# Patient Record
Sex: Female | Born: 1995 | Race: Black or African American | Hispanic: No | Marital: Single | Smoking: Never smoker
Health system: Southern US, Community
[De-identification: ages and names within clinical notes are randomized; demographics above are authoritative.]

## PROBLEM LIST (undated history)

## (undated) DIAGNOSIS — F329 Major depressive disorder, single episode, unspecified: Secondary | ICD-10-CM

## (undated) DIAGNOSIS — F32A Depression, unspecified: Secondary | ICD-10-CM

## (undated) DIAGNOSIS — R519 Headache, unspecified: Secondary | ICD-10-CM

## (undated) DIAGNOSIS — E119 Type 2 diabetes mellitus without complications: Secondary | ICD-10-CM

## (undated) DIAGNOSIS — Z8759 Personal history of other complications of pregnancy, childbirth and the puerperium: Secondary | ICD-10-CM

## (undated) DIAGNOSIS — A609 Anogenital herpesviral infection, unspecified: Secondary | ICD-10-CM

## (undated) DIAGNOSIS — Z5189 Encounter for other specified aftercare: Secondary | ICD-10-CM

## (undated) DIAGNOSIS — O24419 Gestational diabetes mellitus in pregnancy, unspecified control: Secondary | ICD-10-CM

## (undated) DIAGNOSIS — T8859XA Other complications of anesthesia, initial encounter: Secondary | ICD-10-CM

## (undated) HISTORY — DX: Type 2 diabetes mellitus without complications: E11.9

## (undated) HISTORY — DX: Depression, unspecified: F32.A

## (undated) HISTORY — DX: Headache, unspecified: R51.9

## (undated) HISTORY — PX: WISDOM TOOTH EXTRACTION: SHX21

## (undated) HISTORY — DX: Anogenital herpesviral infection, unspecified: A60.9

---
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
## 2016-06-17 ENCOUNTER — Emergency Department (HOSPITAL_COMMUNITY)
Admission: EM | Admit: 2016-06-17 | Discharge: 2016-06-18 | Discharge status: Against Medical Advice | Payer: Medicaid Other | Attending: Dermatology | Admitting: Dermatology

## 2016-06-17 ENCOUNTER — Encounter (HOSPITAL_COMMUNITY): Payer: Self-pay

## 2016-06-17 DIAGNOSIS — Z5321 Procedure and treatment not carried out due to patient leaving prior to being seen by health care provider: Secondary | ICD-10-CM | POA: Diagnosis not present

## 2016-06-17 DIAGNOSIS — O26891 Other specified pregnancy related conditions, first trimester: Secondary | ICD-10-CM | POA: Diagnosis not present

## 2016-06-17 DIAGNOSIS — Z3A01 Less than 8 weeks gestation of pregnancy: Secondary | ICD-10-CM | POA: Diagnosis not present

## 2016-06-17 NOTE — ED Triage Notes (Signed)
Pt c/o migraine and pain behind her R etye since Thursday. Pt endorsing photo sensitivity. Denies Nausea/vomiting. Pt is [redacted] weeks pregnant.

## 2019-10-17 ENCOUNTER — Emergency Department (HOSPITAL_COMMUNITY)
Admission: EM | Admit: 2019-10-17 | Discharge: 2019-10-17 | Disposition: A | Discharge status: Home / Self Care | Payer: Medicaid Other | Attending: Emergency Medicine | Admitting: Emergency Medicine

## 2019-10-17 ENCOUNTER — Other Ambulatory Visit: Payer: Self-pay

## 2019-10-17 ENCOUNTER — Ambulatory Visit (HOSPITAL_COMMUNITY)
Admission: EM | Admit: 2019-10-17 | Discharge: 2019-10-17 | Disposition: A | Discharge status: Home / Self Care | Payer: Medicaid Other

## 2019-10-17 ENCOUNTER — Encounter (HOSPITAL_COMMUNITY): Payer: Self-pay | Admitting: Emergency Medicine

## 2019-10-17 ENCOUNTER — Emergency Department (HOSPITAL_COMMUNITY): Payer: Medicaid Other

## 2019-10-17 DIAGNOSIS — M546 Pain in thoracic spine: Secondary | ICD-10-CM | POA: Insufficient documentation

## 2019-10-17 DIAGNOSIS — X500XXA Overexertion from strenuous movement or load, initial encounter: Secondary | ICD-10-CM | POA: Insufficient documentation

## 2019-10-17 DIAGNOSIS — R0789 Other chest pain: Secondary | ICD-10-CM | POA: Insufficient documentation

## 2019-10-17 DIAGNOSIS — Y93F2 Activity, caregiving, lifting: Secondary | ICD-10-CM | POA: Insufficient documentation

## 2019-10-17 LAB — BASIC METABOLIC PANEL
Anion gap: 10 (ref 5–15)
BUN: 6 mg/dL (ref 6–20)
CO2: 25 mmol/L (ref 22–32)
Calcium: 8.4 mg/dL — ABNORMAL LOW (ref 8.9–10.3)
Chloride: 103 mmol/L (ref 98–111)
Creatinine, Ser: 0.63 mg/dL (ref 0.44–1.00)
GFR calc Af Amer: >60 mL/min (ref >60)
GFR calc non Af Amer: >60 mL/min (ref >60)
Glucose, Bld: 102 mg/dL — ABNORMAL HIGH (ref 70–99)
Potassium: 4.1 mmol/L (ref 3.5–5.1)
Sodium: 138 mmol/L (ref 135–145)

## 2019-10-17 LAB — TROPONIN I (HIGH SENSITIVITY)
Troponin I (High Sensitivity): <2 ng/L (ref <18)
Troponin I (High Sensitivity): <2 ng/L (ref <18)

## 2019-10-17 LAB — CBC
HCT: 42.2 % (ref 36.0–46.0)
Hemoglobin: 12.6 g/dL (ref 12.0–15.0)
MCH: 24.5 pg — ABNORMAL LOW (ref 26.0–34.0)
MCHC: 29.9 g/dL — ABNORMAL LOW (ref 30.0–36.0)
MCV: 81.9 fL (ref 80.0–100.0)
Platelets: 219 10*3/uL (ref 150–400)
RBC: 5.15 MIL/uL — ABNORMAL HIGH (ref 3.87–5.11)
RDW: 15 % (ref 11.5–15.5)
WBC: 8.2 10*3/uL (ref 4.0–10.5)
nRBC: 0 % (ref 0.0–0.2)

## 2019-10-17 LAB — I-STAT BETA HCG BLOOD, ED (MC, WL, AP ONLY): I-stat hCG, quantitative: <5 m[IU]/mL (ref <5)

## 2019-10-17 MED ORDER — SODIUM CHLORIDE 0.9% FLUSH: 3.0000 mL | Freq: Once | INTRAVENOUS | Status: DC

## 2019-10-17 MED ORDER — TIZANIDINE HCL 2 MG PO CAPS: 2.0000 mg | ORAL_CAPSULE | Freq: Three times a day (TID) | ORAL | 0 refills | Status: DC

## 2019-10-17 NOTE — ED Provider Notes (Signed)
[facility] EMERGENCY DEPARTMENT Provider Note   CSN: [id number] Arrival date & time: 10/17/19  1221     History Chief Complaint  Patient presents with  . Back Pain  . Chest Pain    Sherry Gonzales is a 24 y.o. female.  HPI    This young female presents with concern of back and chest pain.  She states that she is generally well, though she does have ongoing evaluation for proteinuria, as well as therapy for UTI. Yesterday, after lifting another individual she felt relatively sudden onset pain in the midthoracic back. Pain is sore, spasm-like, and since onset has had periods of circumferential pain throughout her anterior thorax as well. No dyspnea, no fever, no nausea, no vomiting.  She has not taken any medication for pain relief.  She notes that she has a history of anxiety, and today, after the pain wrapped around to her chest, and she was particularly anxious that she went to urgent care.  With concern of chest pain she was sent here for evaluation.  History reviewed. No pertinent past medical history.  There are no problems to display for this patient.   No past surgical history on file.   OB History    Gravida  1   Para      Term      Preterm      AB      Living        SAB      TAB      Ectopic      Multiple      Live Births              No family history on file.  Social History   Tobacco Use  . Smoking status: Never Smoker  . Smokeless tobacco: Never Used  Substance Use Topics  . Alcohol use: Not on file  . Drug use: Not on file    Home Medications Prior to Admission medications   Not on File    Allergies    Penicillins  Review of Systems   Review of Systems  Constitutional:       Per HPI, otherwise negative  HENT:       Per HPI, otherwise negative  Respiratory:       Per HPI, otherwise negative  Cardiovascular:       Per HPI, otherwise negative  Gastrointestinal: Negative for vomiting.  Endocrine:      Negative aside from HPI  Genitourinary:       Neg aside from HPI   Musculoskeletal:       Per HPI, otherwise negative  Skin: Negative.   Neurological: Negative for syncope.    Physical Exam Updated Vital Signs BP (!) 117/91 (BP Location: Right Arm)   Pulse 84   Temp 98.8 F (37.1 C) (Oral)   Resp 16   SpO2 99%   Physical Exam Vitals and nursing note reviewed.  Constitutional:      General: She is not in acute distress.    Appearance: She is well-developed.  HENT:     Head: Normocephalic and atraumatic.  Eyes:     Conjunctiva/sclera: Conjunctivae normal.  Cardiovascular:     Rate and Rhythm: Normal rate and regular rhythm.  Pulmonary:     Effort: Pulmonary effort is normal. No respiratory distress.     Breath sounds: Normal breath sounds. No stridor. No decreased breath sounds or wheezing.  Abdominal:     General: There is no  distension.  Musculoskeletal:     Comments: Mild tenderness to palpation mid thoracic area, no crepitus, no deformities, no step-off  Skin:    General: Skin is warm and dry.  Neurological:     Mental Status: She is alert and oriented to person, place, and time.     Cranial Nerves: No cranial nerve deficit.     Motor: No weakness, tremor, atrophy or abnormal muscle tone.     Coordination: Coordination normal.     ED Results / Procedures / Treatments   Labs (all labs ordered are listed, but only abnormal results are displayed) Labs Reviewed  BASIC METABOLIC PANEL - Abnormal; Notable for the following components:      Result Value   Glucose, Bld 102 (*)    Calcium 8.4 (*)    All other components within normal limits  CBC - Abnormal; Notable for the following components:   RBC 5.15 (*)    MCH 24.5 (*)    MCHC 29.9 (*)    All other components within normal limits  I-STAT BETA HCG BLOOD, ED ([facility], [facility], [facility] ONLY)  TROPONIN I (HIGH SENSITIVITY)  TROPONIN I (HIGH SENSITIVITY)    EKG EKG Interpretation  Date/Time:  Thursday October 17 2019  12:30:17 EST Ventricular Rate:  89 PR Interval:  130 QRS Duration: 74 QT Interval:  370 QTC Calculation: 450 R Axis:   56 Text Interpretation: Normal sinus rhythm Artifact Otherwise within normal limits Confirmed by Gerhard Munch [phone]) on 10/17/2019 3:12:38 PM   Radiology DG Chest 2 View  Result Date: 10/17/2019 CLINICAL DATA:  Chest pain today. EXAM: CHEST - 2 VIEW COMPARISON:  None. FINDINGS: Lungs clear. Heart size normal. No pneumothorax or pleural fluid. No acute or focal bony abnormality. IMPRESSION: Normal chest. Electronically Signed   By: Drusilla Kanner M.D.   On: 10/17/2019 13:00    Procedures Procedures (including critical care time)  Medications Ordered in ED Medications  sodium chloride flush (NS) 0.9 % injection 3 mL (has no administration in time range)    ED Course  I have reviewed the triage vital signs and the nursing notes.  Pertinent labs & imaging results that were available during my care of the patient were reviewed by me and considered in my medical decision making (see chart for details).    MDM Rules/Calculators/A&P                      Young female presents with midthoracic and chest pain after identifiable precipitant. With her generally benign health condition, onset of pain following lifting other individuals or suspicion for musculoskeletal etiology. No red flags, no neuro findings, nor complaints.  Patient has no history of cardiac disease, EKG is unremarkable, labs unremarkable.  Patient has not yet taken any medication for relief, was started on a course of anti-inflammatories, muscle relaxants. Patient amenable to follow-up with physical therapy and/or chiropractic for additional ongoing care. Final Clinical Impression(s) / ED Diagnoses Final diagnoses:  Acute midline thoracic back pain  Atypical chest pain    Rx / DC Orders ED Discharge Orders         Ordered    tizanidine (ZANAFLEX) 2 MG capsule  3 times daily     10/17/19 1555            Gerhard Munch, MD 10/17/19 1556

## 2019-10-17 NOTE — Discharge Instructions (Signed)
As discussed, your evaluation today has been largely reassuring.  But, it is important that you monitor your condition carefully, and do not hesitate to return to the ED if you develop new, or concerning changes in your condition.  For the next 3 days please use ibuprofen, 400 mg, 3 times daily, taken with food for relief. In addition, you may use the prescribed muscle relaxer for additional therapeutic benefit.

## 2019-10-17 NOTE — ED Triage Notes (Signed)
Pt states " I pulled a muscle in my back yesterday after picking up a friend". Pt states just PTA she was standing at [facility] when suddenly the pain went into her chest and patient had someone at the store drive her here. Pt is hunched over in triage and states she cant sit up due to pain.

## 2019-10-17 NOTE — ED Notes (Signed)
Patient verbalizes understanding of discharge instructions. Opportunity for questioning and answers were provided. Armband removed by staff, pt discharged from ED.  

## 2019-10-17 NOTE — ED Notes (Addendum)
[phone] Winders- mother

## 2020-02-19 LAB — OB RESULTS CONSOLE GC/CHLAMYDIA
Chlamydia: NEGATIVE
Gonorrhea: NEGATIVE

## 2020-02-19 LAB — OB RESULTS CONSOLE ABO/RH: RH Type: POSITIVE

## 2020-02-19 LAB — OB RESULTS CONSOLE HEPATITIS B SURFACE ANTIGEN: Hepatitis B Surface Ag: NEGATIVE

## 2020-02-19 LAB — OB RESULTS CONSOLE HIV ANTIBODY (ROUTINE TESTING): HIV: NONREACTIVE

## 2020-02-19 LAB — OB RESULTS CONSOLE RUBELLA ANTIBODY, IGM: Rubella: IMMUNE

## 2020-02-19 LAB — OB RESULTS CONSOLE RPR: RPR: NONREACTIVE

## 2020-02-19 LAB — OB RESULTS CONSOLE ANTIBODY SCREEN: Antibody Screen: NEGATIVE

## 2020-04-01 ENCOUNTER — Encounter: Payer: Medicaid Other | Attending: Obstetrics and Gynecology | Admitting: Registered"

## 2020-05-22 ENCOUNTER — Other Ambulatory Visit: Payer: Self-pay | Admitting: Obstetrics and Gynecology

## 2020-05-22 DIAGNOSIS — Z3A22 22 weeks gestation of pregnancy: Secondary | ICD-10-CM

## 2020-05-22 DIAGNOSIS — Z36 Encounter for antenatal screening for chromosomal anomalies: Secondary | ICD-10-CM

## 2020-05-28 ENCOUNTER — Encounter: Payer: Self-pay | Admitting: *Deleted

## 2020-06-01 ENCOUNTER — Ambulatory Visit: Payer: Medicaid Other | Attending: Obstetrics and Gynecology

## 2020-06-01 ENCOUNTER — Ambulatory Visit: Payer: Medicaid Other

## 2020-06-08 ENCOUNTER — Emergency Department (HOSPITAL_COMMUNITY)
Admission: EM | Admit: 2020-06-08 | Discharge: 2020-06-08 | Disposition: A | Discharge status: Home / Self Care | Payer: Medicaid Other | Attending: Emergency Medicine | Admitting: Emergency Medicine

## 2020-06-08 ENCOUNTER — Emergency Department (HOSPITAL_COMMUNITY): Payer: Medicaid Other

## 2020-06-08 ENCOUNTER — Encounter (HOSPITAL_COMMUNITY): Payer: Self-pay

## 2020-06-08 ENCOUNTER — Other Ambulatory Visit: Payer: Self-pay

## 2020-06-08 DIAGNOSIS — Z3A00 Weeks of gestation of pregnancy not specified: Secondary | ICD-10-CM | POA: Insufficient documentation

## 2020-06-08 DIAGNOSIS — R079 Chest pain, unspecified: Secondary | ICD-10-CM | POA: Insufficient documentation

## 2020-06-08 DIAGNOSIS — R05 Cough: Secondary | ICD-10-CM | POA: Diagnosis not present

## 2020-06-08 DIAGNOSIS — Z5321 Procedure and treatment not carried out due to patient leaving prior to being seen by health care provider: Secondary | ICD-10-CM | POA: Insufficient documentation

## 2020-06-08 DIAGNOSIS — O26899 Other specified pregnancy related conditions, unspecified trimester: Secondary | ICD-10-CM | POA: Diagnosis present

## 2020-06-08 DIAGNOSIS — U071 COVID-19: Secondary | ICD-10-CM | POA: Insufficient documentation

## 2020-06-08 NOTE — ED Triage Notes (Signed)
Patient states she tested + Covid 3 days ago. Patient c/o chest pain and cough.  Patient states she is 6 months pregnant.

## 2020-06-08 NOTE — ED Notes (Signed)
Patient left out of Triage 3 sometime this afternoon and stated she told a nurse that she was leaving. Patient returned and wanted to be seen. Patient is currently in the waiting room. Patient's name was never removed.

## 2020-06-12 ENCOUNTER — Encounter (HOSPITAL_COMMUNITY): Payer: Self-pay | Admitting: Obstetrics and Gynecology

## 2020-06-12 ENCOUNTER — Other Ambulatory Visit: Payer: Self-pay

## 2020-06-12 ENCOUNTER — Inpatient Hospital Stay (HOSPITAL_COMMUNITY)
Admission: AD | Admit: 2020-06-12 | Discharge: 2020-06-12 | Disposition: A | Discharge status: Home / Self Care | Payer: Medicaid Other | Source: Ambulatory Visit | Attending: Obstetrics and Gynecology | Admitting: Obstetrics and Gynecology

## 2020-06-12 ENCOUNTER — Inpatient Hospital Stay (HOSPITAL_COMMUNITY): Payer: Medicaid Other

## 2020-06-12 DIAGNOSIS — Z7984 Long term (current) use of oral hypoglycemic drugs: Secondary | ICD-10-CM | POA: Insufficient documentation

## 2020-06-12 DIAGNOSIS — O24112 Pre-existing diabetes mellitus, type 2, in pregnancy, second trimester: Secondary | ICD-10-CM | POA: Insufficient documentation

## 2020-06-12 DIAGNOSIS — O98512 Other viral diseases complicating pregnancy, second trimester: Secondary | ICD-10-CM | POA: Diagnosis not present

## 2020-06-12 DIAGNOSIS — Z880 Allergy status to penicillin: Secondary | ICD-10-CM | POA: Diagnosis not present

## 2020-06-12 DIAGNOSIS — E119 Type 2 diabetes mellitus without complications: Secondary | ICD-10-CM | POA: Diagnosis not present

## 2020-06-12 DIAGNOSIS — Z3A25 25 weeks gestation of pregnancy: Secondary | ICD-10-CM | POA: Diagnosis not present

## 2020-06-12 DIAGNOSIS — U071 COVID-19: Secondary | ICD-10-CM | POA: Diagnosis not present

## 2020-06-12 DIAGNOSIS — J069 Acute upper respiratory infection, unspecified: Secondary | ICD-10-CM

## 2020-06-12 DIAGNOSIS — Z833 Family history of diabetes mellitus: Secondary | ICD-10-CM | POA: Diagnosis not present

## 2020-06-12 LAB — URINALYSIS, ROUTINE W REFLEX MICROSCOPIC
Bilirubin Urine: NEGATIVE
Glucose, UA: NEGATIVE mg/dL
Hgb urine dipstick: NEGATIVE
Ketones, ur: NEGATIVE mg/dL
Leukocytes,Ua: NEGATIVE
Nitrite: NEGATIVE
Protein, ur: 100 mg/dL — AB
Specific Gravity, Urine: 1.017 (ref 1.005–1.030)
pH: 8 (ref 5.0–8.0)

## 2020-06-12 LAB — GLUCOSE, CAPILLARY: Glucose-Capillary: 101 mg/dL — ABNORMAL HIGH (ref 70–99)

## 2020-06-12 MED ORDER — GUAIFENESIN 200 MG PO TABS
200.0000 mg | ORAL_TABLET | ORAL | Status: DC | PRN
  Administered 2020-06-12: 200 mg via ORAL
  Filled 2020-06-12 (×2): qty 1

## 2020-06-12 MED ORDER — GUAIFENESIN 200 MG PO TABS: 200.0000 mg | ORAL_TABLET | ORAL | 0 refills | Status: AC | PRN

## 2020-06-12 MED ORDER — BENZONATATE 100 MG PO CAPS
200.0000 mg | ORAL_CAPSULE | Freq: Two times a day (BID) | ORAL | Status: DC
  Administered 2020-06-12: 200 mg via ORAL
  Filled 2020-06-12 (×2): qty 2

## 2020-06-12 MED ORDER — BENZONATATE 200 MG PO CAPS: 200.0000 mg | ORAL_CAPSULE | Freq: Two times a day (BID) | ORAL | 0 refills | Status: AC

## 2020-06-12 NOTE — Discharge Instructions (Signed)
10 Things You Can Do to Manage Your COVID-19 Symptoms at Home If you have possible or confirmed COVID-19: 1. Stay home from work and school. And stay away from other public places. If you must go out, avoid using any kind of public transportation, ridesharing, or taxis. 2. Monitor your symptoms carefully. If your symptoms get worse, call your healthcare provider immediately. 3. Get rest and stay hydrated. 4. If you have a medical appointment, call the healthcare provider ahead of time and tell them that you have or may have COVID-19. 5. For medical emergencies, call 911 and notify the dispatch personnel that you have or may have COVID-19. 6. Cover your cough and sneezes with a tissue or use the inside of your elbow. 7. Wash your hands often with soap and water for at least 20 seconds or clean your hands with an alcohol-based hand sanitizer that contains at least 60% alcohol. 8. As much as possible, stay in a specific room and away from other people in your home. Also, you should use a separate bathroom, if available. If you need to be around other people in or outside of the home, wear a mask. 9. Avoid sharing personal items with other people in your household, like dishes, towels, and bedding. 10. Clean all surfaces that are touched often, like counters, tabletops, and doorknobs. Use household cleaning sprays or wipes according to the label instructions. cdc.gov/coronavirus 04/10/2019 This information is not intended to replace advice given to you by your health care provider. Make sure you discuss any questions you have with your health care provider. Document Revised: 09/12/2019 Document Reviewed: 09/12/2019 Elsevier Patient Education  2020 Elsevier Inc.  

## 2020-06-12 NOTE — MAU Note (Signed)
Pt tested positive for covid on Last Friday . C/O bad cough that makes her urinate or have a BM (uncontrolled) Coughing up small amount of clear phlegm .Taking Delsym for the cough without much relief. Denies any fever or SOB. Also reports urine is very dark has history of hematuria and kidney stones but not seeing blood at this time.

## 2020-06-12 NOTE — MAU Provider Note (Signed)
Patient Shauniece Gonzales is a 24 y.o. [id number]  at [redacted]w[redacted]d here with complaints of cough and blood in her urine. She denies decreased fetal movements, vaginal bleeding, contractions, fever, SOB, all over body aches. She is coughing so bad that sometimes she urinates and defecates on herself. She feels like she is improving overall but still has her cough.  She reports that her urine is dark and she was concerned about blood, but she has no true vaginal bleeding.   History     CSN: [id number]  Arrival date and time: 06/12/20 1317   None     Chief Complaint  Patient presents with   Cough   Cough This is a new problem. The current episode started in the past 7 days. The problem has been unchanged. The cough is productive of sputum. Associated symptoms include chest pain and nasal congestion. Pertinent negatives include no fever, rhinorrhea or shortness of breath.   She says that the coughing is worse on exertion. She took half a bottle of Delsym and it did not help.  OB History    Gravida  4   Para  1   Term  1   Preterm      AB  2   Living  1     SAB      TAB  2   Ectopic      Multiple      Live Births  1           Past Medical History:  Diagnosis Date   Depression    Diabetes mellitus without complication (HCC)    Headache    HSV (herpes simplex virus) anogenital infection     Past Surgical History:  Procedure Laterality Date   CESAREAN SECTION      Family History  Problem Relation Age of Onset   Diabetes Mother    Hypertension Mother    Diabetes Father    Hypertension Father     Social History   Tobacco Use   Smoking status: Never Smoker   Smokeless tobacco: Never Used  Vaping Use   Vaping Use: Never used  Substance Use Topics   Alcohol use: Not Currently   Drug use: Never    Allergies:  Allergies  Allergen Reactions   Penicillins Hives    Did it involve swelling of the face/tongue/throat, SOB, or low BP? No Did it  involve sudden or severe rash/hives, skin peeling, or any reaction on the inside of your mouth or nose? Yes Did you need to seek medical attention at a hospital or doctor's office? Yes When did it last happen?pt was 24yo If all above answers are NO, may proceed with cephalosporin use.     Medications Prior to Admission  Medication Sig Dispense Refill Last Dose   metFORMIN (GLUCOPHAGE) 500 MG tablet Take 500 mg by mouth daily with breakfast.   06/12/2020 at Unknown time   Prenatal Vit-Fe Fumarate-FA (PRENATAL MULTIVITAMIN) TABS tablet Take 1 tablet by mouth daily at 12 noon.   06/12/2020 at Unknown time   valACYclovir (VALTREX) 1000 MG tablet Take 0.5 g by mouth See admin instructions. Take 1/2 tablet twice daily for 3-5 days as needed for flare ups.   06/12/2020 at Unknown time   nitrofurantoin, macrocrystal-monohydrate, (MACROBID) 100 MG capsule Take 100 mg by mouth 2 (two) times daily.      tizanidine (ZANAFLEX) 2 MG capsule Take 1 capsule (2 mg total) by mouth 3 (three) times daily. 15 capsule 0  Review of Systems  Constitutional: Negative.  Negative for fever.  HENT: Negative.  Negative for rhinorrhea.   Respiratory: Positive for cough. Negative for shortness of breath.   Cardiovascular: Positive for chest pain.  Gastrointestinal: Negative.   Genitourinary: Negative.   Musculoskeletal: Negative.   Neurological: Negative.    Physical Exam   Blood pressure 109/69, pulse (!) 101, temperature 98.1 F (36.7 C), resp. rate 18, last menstrual period 12/14/2019, SpO2 97 %.  Physical Exam Constitutional:      Appearance: Normal appearance.  HENT:     Head: Normocephalic.  Cardiovascular:     Rate and Rhythm: Normal rate.     Pulses: Normal pulses.  Pulmonary:     Effort: Pulmonary effort is normal. No respiratory distress.     Breath sounds: No stridor. No wheezing or rhonchi.  Genitourinary:    General: Normal vulva.  Neurological:     Mental Status: She is alert.      MAU Course  Procedures  MDM -Consult with COVID Team; per Dr. Jerral Ralph, do not need to order full COVID work-up but will do chest xray to rule out pneumonia; patient is not eligible for monoclonal antibody treatment due to COVID diagnosis further out than 10 days ago.  -patient appears uncomfortable in bed with dry cough but has reassuring Resp and SpO2.  -UA is normal -Blood sugar is 101; patient would like a small snack.  -Patient had guafenisen and tessalon pearls.  -NST: 150 bpm, mod var, present acel, no decels, no contractions.   Patient Vitals for the past 24 hrs:  BP Temp Pulse Resp SpO2  06/12/20 1403 109/69 98.1 F (36.7 C) (!) 101 18 97 %   Patient eating, talking and laughing while in MAU. She continues to have O2 Sat above 95%, and no fever, SOB.  Reports that she has not been coughing while in MAU.   -chest x ray is negative for pneumonia  Assessment and Plan   1. Upper respiratory tract infection due to COVID-19 virus   2. COVID-19    -Patient stable for discharge; patient to keep ob appt per quarantine rules -Patient given strict instructions on when to return to MAU; all questions answered.  -RX for anti-tussives given; patient stable for discharge.    Sherry Gonzales Sherry Gonzales 06/12/2020, 4:32 PM

## 2020-08-24 LAB — OB RESULTS CONSOLE GBS: GBS: POSITIVE

## 2020-08-29 ENCOUNTER — Emergency Department (HOSPITAL_COMMUNITY)
Admission: EM | Admit: 2020-08-29 | Discharge: 2020-08-29 | Discharge status: Absent without leave | Payer: Medicaid Other

## 2020-08-31 NOTE — Patient Instructions (Signed)
Sherry Gonzales  08/31/2020   Your procedure is scheduled on:  09/15/2020  Arrive at 0530 at Entrance C on [address] at [facility]  and [address]. You are invited to use the FREE valet parking or use the Visitor's parking deck.  Pick up the phone at the desk and dial [phone].  Call this number if you have problems the morning of surgery: [phone]  Remember:   Do not eat food:(After Midnight) Desps de medianoche.  Do not drink clear liquids: (After Midnight) Desps de medianoche.  Take these medicines the morning of surgery with A SIP OF WATER:  Take valtrex as prescribed   Do not wear jewelry, make-up or nail polish.  Do not wear lotions, powders, or perfumes. Do not wear deodorant.  Do not shave 48 hours prior to surgery.  Do not bring valuables to the hospital.  [facility] is not   responsible for any belongings or valuables brought to the hospital.  Contacts, dentures or bridgework may not be worn into surgery.  Leave suitcase in the car. After surgery it may be brought to your room.  For patients admitted to the hospital, checkout time is 11:00 AM the day of              discharge.      Please read over the following fact sheets that you were given:     Preparing for Surgery

## 2020-09-01 ENCOUNTER — Telehealth (HOSPITAL_COMMUNITY): Payer: Self-pay | Admitting: *Deleted

## 2020-09-01 NOTE — Telephone Encounter (Signed)
Preadmission screen  

## 2020-09-02 ENCOUNTER — Encounter (HOSPITAL_COMMUNITY): Payer: Self-pay

## 2020-09-13 NOTE — H&P (Deleted)
  The note originally documented on this encounter has been moved the the encounter in which it belongs.  

## 2020-09-13 NOTE — H&P (Addendum)
Sherry Gonzales is a 51 [id number] female presenting for scheduled elective repeat cesarean section and bilateral tubal ligation. Pt is dated per LMP which was confirmed with a 9 week [place]. The pregnancy has been complicated by new dx of type 2 DM - controlled on metformin, hx herpes - has been on prophylaxis since 36 weeks, maternal obesity, hx depression, b/l kidney dilation that resolved by 28 weeks, covid infection and gonorrhea in pregnancy - treated. She declined chromosomal and genetic screening.  Pt had her first c/s for arrest of descent . Was a traumatic experience for her. Prefers elective repeat c/s with bilateral tubal ligation OB History    Gravida  4   Para  1   Term  1   Preterm      AB  2   Living  1     SAB      TAB  2   Ectopic      Multiple      Live Births  1          Past Medical History:  Diagnosis Date  . Blood transfusion without reported diagnosis   . Complication of anesthesia    had poor pain controll with last CS.  . Depression   . Diabetes mellitus without complication (HCC)   . Gestational diabetes   . Headache   . History of postpartum hemorrhage   . HSV (herpes simplex virus) anogenital infection    Past Surgical History:  Procedure Laterality Date  . CESAREAN SECTION    . WISDOM TOOTH EXTRACTION     Family History: family history includes Diabetes in her father and mother; Hypertension in her father and mother. Social History:  reports that she has never smoked. She has never used smokeless tobacco. She reports previous alcohol use. She reports that she does not use drugs.     Maternal Diabetes: Yes:  Diabetes Type:  Pre-pregnancy Genetic Screening: Declined Maternal Ultrasounds/Referrals: Normal Fetal Ultrasounds or other Referrals:  None Maternal Substance Abuse:  No Significant Maternal Medications:  None Significant Maternal Lab Results:  Group B Strep positive Other Comments:  None  Review of Systems  Constitutional:  Negative for activity change and fatigue.  Eyes: Negative for visual disturbance.  Respiratory: Negative for chest tightness and shortness of breath.   Cardiovascular: Positive for leg swelling. Negative for chest pain and palpitations.  Gastrointestinal: Negative for abdominal pain.  Genitourinary: Positive for pelvic pain.  Neurological: Negative for light-headedness and headaches.  Psychiatric/Behavioral: The patient is nervous/anxious.    Maternal Medical History:  Reason for admission: Repeat c/s - elective   Fetal activity: Perceived fetal activity is normal.    Prenatal complications: no prenatal complications Prenatal Complications - Diabetes: type 2. Diabetes is managed by oral agent (monotherapy).        Last menstrual period 12/14/2019. Maternal Exam:  Uterine Assessment: Contraction frequency is rare.   Abdomen: Patient reports generalized tenderness.  Estimated fetal weight is AGA.   Fetal presentation: vertex  Introitus: Normal vulva. Vulva is negative for condylomata and lesion.  Normal vagina.  Vagina is negative for condylomata.  Cervix: Cervix evaluated by digital exam.     Fetal Exam Fetal Monitor Review: Baseline rate: 140.      Physical Exam Vitals and nursing note reviewed. Exam conducted with a chaperone present.  Cardiovascular:     Pulses: Normal pulses.  Pulmonary:     Effort: Pulmonary effort is normal.  Abdominal:     Tenderness: There is  generalized abdominal tenderness.  Genitourinary:    General: Normal vulva.  Vulva is no lesion.  Musculoskeletal:        General: Normal range of motion.     Cervical back: Normal range of motion.  Skin:    General: Skin is warm.     Capillary Refill: Capillary refill takes 2 to 3 seconds.  Neurological:     General: No focal deficit present.     Mental Status: She is alert and oriented to person, place, and time. Mental status is at baseline.  Psychiatric:        Mood and Affect: Mood normal.         Behavior: Behavior normal.        Thought Content: Thought content normal.        Judgment: Judgment normal.     Prenatal labs: ABO, Rh: A/Positive/-- (05/12 0000) Antibody: Negative (05/12 0000) Rubella: Immune (05/12 0000) RPR: Nonreactive (05/12 0000)  HBsAg: Negative (05/12 0000)  HIV: Non-reactive (05/12 0000)  GBS: Positive/-- (11/15 0000)   Assessment/Plan: 02DX [id number] female presenting at 44 3/[redacted]wks ga for elective repeat c/s and bilateral tubal ligation - stable -Admit -Sars covid screen -ERAS protocol -SCDs to OR  -Ancef  -Verify consent    Cathrine Muster 09/13/2020, 3:42 AM

## 2020-09-14 ENCOUNTER — Other Ambulatory Visit (HOSPITAL_COMMUNITY)
Admission: RE | Admit: 2020-09-14 | Discharge: 2020-09-14 | Disposition: A | Discharge status: Home / Self Care | Payer: Medicaid Other | Source: Ambulatory Visit | Attending: Obstetrics and Gynecology | Admitting: Obstetrics and Gynecology

## 2020-09-14 ENCOUNTER — Encounter (HOSPITAL_COMMUNITY): Payer: Self-pay

## 2020-09-14 ENCOUNTER — Other Ambulatory Visit: Payer: Self-pay

## 2020-09-14 ENCOUNTER — Encounter (HOSPITAL_COMMUNITY)
Admission: RE | Admit: 2020-09-14 | Discharge: 2020-09-14 | Disposition: A | Discharge status: Home / Self Care | Payer: Medicaid Other | Source: Ambulatory Visit | Attending: Obstetrics and Gynecology | Admitting: Obstetrics and Gynecology

## 2020-09-14 DIAGNOSIS — Z01812 Encounter for preprocedural laboratory examination: Secondary | ICD-10-CM | POA: Insufficient documentation

## 2020-09-14 DIAGNOSIS — Z20822 Contact with and (suspected) exposure to covid-19: Secondary | ICD-10-CM | POA: Insufficient documentation

## 2020-09-14 HISTORY — DX: Encounter for other specified aftercare: Z51.89

## 2020-09-14 HISTORY — DX: Personal history of other complications of pregnancy, childbirth and the puerperium: Z87.59

## 2020-09-14 HISTORY — DX: Gestational diabetes mellitus in pregnancy, unspecified control: O24.419

## 2020-09-14 HISTORY — DX: Other complications of anesthesia, initial encounter: T88.59XA

## 2020-09-14 LAB — TYPE AND SCREEN
ABO/RH(D): A POS
Antibody Screen: NEGATIVE

## 2020-09-14 LAB — CBC
HCT: 35.3 % — ABNORMAL LOW (ref 36.0–46.0)
Hemoglobin: 10.8 g/dL — ABNORMAL LOW (ref 12.0–15.0)
MCH: 23.7 pg — ABNORMAL LOW (ref 26.0–34.0)
MCHC: 30.6 g/dL (ref 30.0–36.0)
MCV: 77.6 fL — ABNORMAL LOW (ref 80.0–100.0)
Platelets: 167 10*3/uL (ref 150–400)
RBC: 4.55 MIL/uL (ref 3.87–5.11)
RDW: 17.2 % — ABNORMAL HIGH (ref 11.5–15.5)
WBC: 9 10*3/uL (ref 4.0–10.5)
nRBC: 0 % (ref 0.0–0.2)

## 2020-09-14 LAB — RPR: RPR Ser Ql: NONREACTIVE

## 2020-09-14 LAB — SARS CORONAVIRUS 2 (TAT 6-24 HRS): SARS Coronavirus 2: NEGATIVE

## 2020-09-14 NOTE — Anesthesia Preprocedure Evaluation (Addendum)
Anesthesia Evaluation  Patient identified by MRN, date of birth, ID band Patient awake    Reviewed: Allergy & Precautions, NPO status , Patient's Chart, lab work & pertinent test results  History of Anesthesia Complications (+) history of anesthetic complications  Airway Mallampati: II  TM Distance: >3 FB Neck ROM: Full    Dental no notable dental hx. (+) Teeth Intact, Dental Advisory Given   Pulmonary neg pulmonary ROS,    Pulmonary exam normal breath sounds clear to auscultation       Cardiovascular Exercise Tolerance: Good negative cardio ROS Normal cardiovascular exam Rhythm:Regular Rate:Normal     Neuro/Psych  Headaches, Depression    GI/Hepatic negative GI ROS, Neg liver ROS,   Endo/Other  diabetes, Well Controlled, Gestational  Renal/GU      Musculoskeletal negative musculoskeletal ROS (+)   Abdominal (+) + obese,   Peds  Hematology Hgb 10.8 Plt 167   Anesthesia Other Findings   Reproductive/Obstetrics (+) Pregnancy                           Anesthesia Physical Anesthesia Plan  ASA: III  Anesthesia Plan: Spinal   Post-op Pain Management:    Induction:   PONV Risk Score and Plan: 3 and Treatment may vary due to age or medical condition and Ondansetron  Airway Management Planned: Nasal Cannula and Natural Airway  Additional Equipment: None  Intra-op Plan:   Post-operative Plan:   Informed Consent: I have reviewed the patients History and Physical, chart, labs and discussed the procedure including the risks, benefits and alternatives for the proposed anesthesia with the patient or authorized representative who has indicated his/her understanding and acceptance.     Dental advisory given  Plan Discussed with: CRNA and Anesthesiologist  Anesthesia Plan Comments: (39 5/7 wks G4P1 for repeat C/S w pptl under SPinal)      Anesthesia Quick Evaluation

## 2020-09-15 ENCOUNTER — Encounter (HOSPITAL_COMMUNITY): Payer: Self-pay | Admitting: Obstetrics and Gynecology

## 2020-09-15 ENCOUNTER — Inpatient Hospital Stay (HOSPITAL_COMMUNITY): Payer: Medicaid Other | Admitting: Anesthesiology

## 2020-09-15 ENCOUNTER — Inpatient Hospital Stay (HOSPITAL_COMMUNITY)
Admission: RE | Admit: 2020-09-15 | Payer: Medicaid Other | Source: Home / Self Care | Admitting: Obstetrics and Gynecology

## 2020-09-15 ENCOUNTER — Inpatient Hospital Stay (HOSPITAL_COMMUNITY)
Admission: AD | Admit: 2020-09-15 | Discharge: 2020-09-17 | DRG: 783 | Disposition: A | Discharge status: Home / Self Care | Payer: Medicaid Other | Attending: Obstetrics and Gynecology | Admitting: Obstetrics and Gynecology

## 2020-09-15 ENCOUNTER — Encounter (HOSPITAL_COMMUNITY)
Admission: AD | Disposition: A | Discharge status: Home / Self Care | Payer: Self-pay | Source: Home / Self Care | Attending: Obstetrics and Gynecology

## 2020-09-15 DIAGNOSIS — Z880 Allergy status to penicillin: Secondary | ICD-10-CM

## 2020-09-15 DIAGNOSIS — O2412 Pre-existing diabetes mellitus, type 2, in childbirth: Secondary | ICD-10-CM | POA: Diagnosis present

## 2020-09-15 DIAGNOSIS — O99214 Obesity complicating childbirth: Secondary | ICD-10-CM | POA: Diagnosis present

## 2020-09-15 DIAGNOSIS — E119 Type 2 diabetes mellitus without complications: Secondary | ICD-10-CM | POA: Diagnosis present

## 2020-09-15 DIAGNOSIS — Z3A39 39 weeks gestation of pregnancy: Secondary | ICD-10-CM | POA: Diagnosis not present

## 2020-09-15 DIAGNOSIS — O9832 Other infections with a predominantly sexual mode of transmission complicating childbirth: Secondary | ICD-10-CM | POA: Diagnosis present

## 2020-09-15 DIAGNOSIS — Z20822 Contact with and (suspected) exposure to covid-19: Secondary | ICD-10-CM | POA: Diagnosis present

## 2020-09-15 DIAGNOSIS — Z302 Encounter for sterilization: Secondary | ICD-10-CM | POA: Diagnosis not present

## 2020-09-15 DIAGNOSIS — Z98891 History of uterine scar from previous surgery: Secondary | ICD-10-CM

## 2020-09-15 DIAGNOSIS — Z7984 Long term (current) use of oral hypoglycemic drugs: Secondary | ICD-10-CM

## 2020-09-15 DIAGNOSIS — A6000 Herpesviral infection of urogenital system, unspecified: Secondary | ICD-10-CM | POA: Diagnosis present

## 2020-09-15 DIAGNOSIS — O34211 Maternal care for low transverse scar from previous cesarean delivery: Principal | ICD-10-CM | POA: Diagnosis present

## 2020-09-15 DIAGNOSIS — Z9851 Tubal ligation status: Secondary | ICD-10-CM

## 2020-09-15 LAB — GLUCOSE, CAPILLARY
Glucose-Capillary: 94 mg/dL (ref 70–99)
Glucose-Capillary: 80 mg/dL (ref 70–99)
Glucose-Capillary: 92 mg/dL (ref 70–99)

## 2020-09-15 SURGERY — Surgical Case
Anesthesia: Spinal

## 2020-09-15 MED ORDER — IBUPROFEN 800 MG PO TABS
800.0000 mg | ORAL_TABLET | Freq: Three times a day (TID) | ORAL | Status: DC
  Administered 2020-09-15 – 2020-09-17 (×6): 800 mg via ORAL
  Filled 2020-09-15 (×6): qty 1

## 2020-09-15 MED ORDER — OXYTOCIN-SODIUM CHLORIDE 30-0.9 UT/500ML-% IV SOLN: 2.5000 [IU]/h | INTRAVENOUS | Status: AC

## 2020-09-15 MED ORDER — SIMETHICONE 80 MG PO CHEW
80.0000 mg | CHEWABLE_TABLET | Freq: Three times a day (TID) | ORAL | Status: DC
  Administered 2020-09-15 – 2020-09-17 (×5): 80 mg via ORAL
  Filled 2020-09-15 (×5): qty 1

## 2020-09-15 MED ORDER — DIPHENHYDRAMINE HCL 25 MG PO CAPS: 25.0000 mg | ORAL_CAPSULE | Freq: Four times a day (QID) | ORAL | Status: DC | PRN

## 2020-09-15 MED ORDER — LACTATED RINGERS IV BOLUS
500.0000 mL | Freq: Once | INTRAVENOUS | Status: AC
  Administered 2020-09-15: 500 mL via INTRAVENOUS

## 2020-09-15 MED ORDER — METFORMIN HCL 500 MG PO TABS
500.0000 mg | ORAL_TABLET | Freq: Every evening | ORAL | Status: DC
  Administered 2020-09-16: 500 mg via ORAL
  Filled 2020-09-15 (×2): qty 1

## 2020-09-15 MED ORDER — CLINDAMYCIN PHOSPHATE 900 MG/50ML IV SOLN: 900.0000 mg | INTRAVENOUS | Status: DC

## 2020-09-15 MED ORDER — GABAPENTIN 100 MG PO CAPS
100.0000 mg | ORAL_CAPSULE | Freq: Two times a day (BID) | ORAL | Status: DC
  Administered 2020-09-16 – 2020-09-17 (×2): 100 mg via ORAL
  Filled 2020-09-15 (×3): qty 1

## 2020-09-15 MED ORDER — SENNOSIDES-DOCUSATE SODIUM 8.6-50 MG PO TABS
2.0000 | ORAL_TABLET | ORAL | Status: DC
  Administered 2020-09-16 – 2020-09-17 (×2): 2 via ORAL
  Filled 2020-09-15 (×2): qty 2

## 2020-09-15 MED ORDER — LACTATED RINGERS IV SOLN: INTRAVENOUS | Status: DC | PRN

## 2020-09-15 MED ORDER — DIBUCAINE (PERIANAL) 1 % EX OINT: 1.0000 "application " | TOPICAL_OINTMENT | CUTANEOUS | Status: DC | PRN

## 2020-09-15 MED ORDER — CLINDAMYCIN PHOSPHATE 900 MG/50ML IV SOLN
900.0000 mg | Freq: Once | INTRAVENOUS | Status: AC
  Administered 2020-09-15: 900 mg via INTRAVENOUS

## 2020-09-15 MED ORDER — ZOLPIDEM TARTRATE 5 MG PO TABS: 5.0000 mg | ORAL_TABLET | Freq: Every evening | ORAL | Status: DC | PRN

## 2020-09-15 MED ORDER — TERBUTALINE SULFATE 1 MG/ML IJ SOLN
INTRAMUSCULAR | Status: AC
  Administered 2020-09-15: 0.25 mg via SUBCUTANEOUS
  Filled 2020-09-15: qty 1

## 2020-09-15 MED ORDER — COCONUT OIL OIL: 1.0000 "application " | TOPICAL_OIL | Status: DC | PRN

## 2020-09-15 MED ORDER — WITCH HAZEL-GLYCERIN EX PADS: 1.0000 "application " | MEDICATED_PAD | CUTANEOUS | Status: DC | PRN

## 2020-09-15 MED ORDER — PHENYLEPHRINE HCL-NACL 20-0.9 MG/250ML-% IV SOLN
INTRAVENOUS | Status: DC | PRN
  Administered 2020-09-15: 60 ug/min via INTRAVENOUS

## 2020-09-15 MED ORDER — KETOROLAC TROMETHAMINE 30 MG/ML IJ SOLN
30.0000 mg | Freq: Once | INTRAMUSCULAR | Status: AC | PRN
  Administered 2020-09-15: 30 mg via INTRAVENOUS

## 2020-09-15 MED ORDER — LACTATED RINGERS IV SOLN: INTRAVENOUS | Status: DC

## 2020-09-15 MED ORDER — ONDANSETRON HCL 4 MG/2ML IJ SOLN: 4.0000 mg | Freq: Once | INTRAMUSCULAR | Status: DC | PRN

## 2020-09-15 MED ORDER — ONDANSETRON HCL 4 MG/2ML IJ SOLN
INTRAMUSCULAR | Status: DC | PRN
  Administered 2020-09-15: 4 mg via INTRAVENOUS

## 2020-09-15 MED ORDER — MORPHINE SULFATE (PF) 0.5 MG/ML IJ SOLN
INTRAMUSCULAR | Status: DC | PRN
  Administered 2020-09-15: 150 ug via INTRATHECAL

## 2020-09-15 MED ORDER — SOD CITRATE-CITRIC ACID 500-334 MG/5ML PO SOLN
30.0000 mL | ORAL | Status: AC
  Administered 2020-09-15: 30 mL via ORAL
  Filled 2020-09-15: qty 15

## 2020-09-15 MED ORDER — GENTAMICIN SULFATE 40 MG/ML IJ SOLN
5.0000 mg/kg | INTRAVENOUS | Status: DC
  Filled 2020-09-15: qty 15.25

## 2020-09-15 MED ORDER — OXYTOCIN-SODIUM CHLORIDE 30-0.9 UT/500ML-% IV SOLN
INTRAVENOUS | Status: DC | PRN
  Administered 2020-09-15 (×2): 150 mL via INTRAVENOUS

## 2020-09-15 MED ORDER — PROMETHAZINE HCL 25 MG/ML IJ SOLN
25.0000 mg | Freq: Once | INTRAMUSCULAR | Status: AC
  Administered 2020-09-15: 25 mg via INTRAVENOUS
  Filled 2020-09-15: qty 1

## 2020-09-15 MED ORDER — SIMETHICONE 80 MG PO CHEW: 80.0000 mg | CHEWABLE_TABLET | ORAL | Status: DC | PRN

## 2020-09-15 MED ORDER — ACETAMINOPHEN 500 MG PO TABS
1000.0000 mg | ORAL_TABLET | Freq: Four times a day (QID) | ORAL | Status: DC
  Administered 2020-09-15 – 2020-09-17 (×7): 1000 mg via ORAL
  Filled 2020-09-15 (×7): qty 2

## 2020-09-15 MED ORDER — TERBUTALINE SULFATE 1 MG/ML IJ SOLN: 0.2500 mg | Freq: Once | INTRAMUSCULAR | Status: AC

## 2020-09-15 MED ORDER — OXYCODONE HCL 5 MG PO TABS: 5.0000 mg | ORAL_TABLET | Freq: Once | ORAL | Status: DC | PRN

## 2020-09-15 MED ORDER — KETOROLAC TROMETHAMINE 30 MG/ML IJ SOLN: 30.0000 mg | Freq: Once | INTRAMUSCULAR | Status: DC

## 2020-09-15 MED ORDER — FENTANYL CITRATE (PF) 100 MCG/2ML IJ SOLN
INTRAMUSCULAR | Status: DC | PRN
  Administered 2020-09-15: 15 ug via INTRATHECAL

## 2020-09-15 MED ORDER — ACETAMINOPHEN 500 MG PO TABS
1000.0000 mg | ORAL_TABLET | ORAL | Status: AC
  Administered 2020-09-15: 1000 mg via ORAL
  Filled 2020-09-15: qty 2

## 2020-09-15 MED ORDER — POVIDONE-IODINE 10 % EX SWAB: 2.0000 "application " | Freq: Once | CUTANEOUS | Status: DC

## 2020-09-15 MED ORDER — OXYCODONE HCL 5 MG PO TABS
5.0000 mg | ORAL_TABLET | ORAL | Status: DC | PRN
  Administered 2020-09-16 (×3): 5 mg via ORAL
  Administered 2020-09-17 (×2): 10 mg via ORAL
  Filled 2020-09-15: qty 1
  Filled 2020-09-15: qty 2
  Filled 2020-09-15 (×2): qty 1
  Filled 2020-09-15: qty 2

## 2020-09-15 MED ORDER — LACTATED RINGERS IV SOLN
INTRAVENOUS | Status: DC
  Administered 2020-09-15: 100 mL/h via INTRAVENOUS

## 2020-09-15 MED ORDER — KETOROLAC TROMETHAMINE 30 MG/ML IJ SOLN
INTRAMUSCULAR | Status: AC
  Filled 2020-09-15: qty 1

## 2020-09-15 MED ORDER — OXYCODONE HCL 5 MG/5ML PO SOLN: 5.0000 mg | Freq: Once | ORAL | Status: DC | PRN

## 2020-09-15 MED ORDER — TETANUS-DIPHTH-ACELL PERTUSSIS 5-2.5-18.5 LF-MCG/0.5 IM SUSY: 0.5000 mL | PREFILLED_SYRINGE | Freq: Once | INTRAMUSCULAR | Status: DC

## 2020-09-15 MED ORDER — PRENATAL MULTIVITAMIN CH
1.0000 | ORAL_TABLET | Freq: Every day | ORAL | Status: DC
  Administered 2020-09-16 – 2020-09-17 (×2): 1 via ORAL
  Filled 2020-09-15 (×2): qty 1

## 2020-09-15 MED ORDER — HYDROMORPHONE HCL 1 MG/ML IJ SOLN
0.2500 mg | INTRAMUSCULAR | Status: DC | PRN
  Administered 2020-09-15: 0.5 mg via INTRAVENOUS

## 2020-09-15 MED ORDER — SCOPOLAMINE 1 MG/3DAYS TD PT72
MEDICATED_PATCH | TRANSDERMAL | Status: AC
  Filled 2020-09-15: qty 1

## 2020-09-15 MED ORDER — BUPIVACAINE IN DEXTROSE 0.75-8.25 % IT SOLN
INTRATHECAL | Status: DC | PRN
  Administered 2020-09-15: 12 mL via INTRATHECAL

## 2020-09-15 MED ORDER — MENTHOL 3 MG MT LOZG: 1.0000 | LOZENGE | OROMUCOSAL | Status: DC | PRN

## 2020-09-15 MED ORDER — SIMETHICONE 80 MG PO CHEW
80.0000 mg | CHEWABLE_TABLET | ORAL | Status: DC
  Administered 2020-09-16 – 2020-09-17 (×2): 80 mg via ORAL
  Filled 2020-09-15 (×2): qty 1

## 2020-09-15 MED ORDER — HYDROMORPHONE HCL 1 MG/ML IJ SOLN
INTRAMUSCULAR | Status: AC
  Filled 2020-09-15: qty 0.5

## 2020-09-15 SURGICAL SUPPLY — 39 items
BENZOIN TINCTURE PRP APPL 2/3 (GAUZE/BANDAGES/DRESSINGS) ×3 IMPLANT
CHLORAPREP W/TINT 26ML (MISCELLANEOUS) ×3 IMPLANT
CLAMP CORD UMBIL (MISCELLANEOUS) IMPLANT
CLOSURE WOUND 1/2 X4 (GAUZE/BANDAGES/DRESSINGS)
CLOTH BEACON ORANGE TIMEOUT ST (SAFETY) ×3 IMPLANT
DRAPE C SECTION CLR SCREEN (DRAPES) ×3 IMPLANT
DRSG OPSITE POSTOP 4X10 (GAUZE/BANDAGES/DRESSINGS) ×3 IMPLANT
ELECT REM PT RETURN 9FT ADLT (ELECTROSURGICAL) ×3
ELECTRODE REM PT RTRN 9FT ADLT (ELECTROSURGICAL) ×1 IMPLANT
EXTRACTOR VACUUM KIWI (MISCELLANEOUS) IMPLANT
GLOVE BIO SURGEON STRL SZ 6.5 (GLOVE) ×2 IMPLANT
GLOVE BIO SURGEONS STRL SZ 6.5 (GLOVE) ×1
GLOVE BIOGEL PI IND STRL 7.0 (GLOVE) ×2 IMPLANT
GLOVE BIOGEL PI INDICATOR 7.0 (GLOVE) ×4
GOWN STRL REUS W/TWL LRG LVL3 (GOWN DISPOSABLE) ×6 IMPLANT
KIT ABG SYR 3ML LUER SLIP (SYRINGE) IMPLANT
NEEDLE HYPO 25X5/8 SAFETYGLIDE (NEEDLE) IMPLANT
NS IRRIG 1000ML POUR BTL (IV SOLUTION) ×3 IMPLANT
PACK C SECTION WH (CUSTOM PROCEDURE TRAY) ×3 IMPLANT
PAD OB MATERNITY 4.3X12.25 (PERSONAL CARE ITEMS) ×3 IMPLANT
RETRACTOR WND ALEXIS 25 LRG (MISCELLANEOUS) ×1 IMPLANT
RTRCTR C-SECT PINK 25CM LRG (MISCELLANEOUS) IMPLANT
RTRCTR WOUND ALEXIS 25CM LRG (MISCELLANEOUS) ×3
STRIP CLOSURE SKIN 1/2X4 (GAUZE/BANDAGES/DRESSINGS) IMPLANT
STRIP SURGICAL 1/2 X 6 IN (GAUZE/BANDAGES/DRESSINGS) ×3 IMPLANT
SUT CHROMIC 1 CTX 36 (SUTURE) ×6 IMPLANT
SUT PLAIN 0 NONE (SUTURE) IMPLANT
SUT PLAIN 2 0 XLH (SUTURE) ×3 IMPLANT
SUT VIC AB 0 CT1 27 (SUTURE) ×4
SUT VIC AB 0 CT1 27XBRD ANBCTR (SUTURE) ×2 IMPLANT
SUT VIC AB 0 CT1 36 (SUTURE) ×6 IMPLANT
SUT VIC AB 2-0 CT1 27 (SUTURE) ×2
SUT VIC AB 2-0 CT1 TAPERPNT 27 (SUTURE) ×1 IMPLANT
SUT VIC AB 3-0 CT1 27 (SUTURE)
SUT VIC AB 3-0 CT1 TAPERPNT 27 (SUTURE) IMPLANT
SUT VIC AB 4-0 KS 27 (SUTURE) ×3 IMPLANT
TOWEL OR 17X24 6PK STRL BLUE (TOWEL DISPOSABLE) ×3 IMPLANT
TRAY FOLEY W/BAG SLVR 14FR LF (SET/KITS/TRAYS/PACK) ×3 IMPLANT
WATER STERILE IRR 1000ML POUR (IV SOLUTION) ×3 IMPLANT

## 2020-09-15 NOTE — Op Note (Signed)
Operative Note    Preoperative Diagnosis 1. IUP at 39 3/7wks  2. Previous c/s - requests elective repeat  3. Complete family status - requests permanent sterilization   Postoperative Diagnosis: Same  Procedure: Repeat cesarean section and bilateral tubal ligation   Surgeon: Britt Bottom DO  Anesthesia: Spinal   Fluids: EBL:  UOP:   Findings: viable female infant in vertex position, Apgars  9,7; weight pending Grossly normal uterus, tubes and ovaries.    Specimen: Placenta to L/D   Procedure Note Patient was taken to the operating room where spinal anesthesia was administered. She was prepped and draped in the normal sterile fashion in the dorsal supine position with a leftward tilt. An appropriate time out was performed. An allis clamp test was performed and anesthesia was found to be adequate.  A Pfannenstiel skin incision was then made through the previous incision with the scalpel and carried through to the underlying layer of fascia by sharp dissection. The fascia was nicked in the midline and the incision was extended laterally with Mayo scissors. The superior aspect of the incision was grasped kocher clamps and dissected off the underlying rectus muscles. In a similar fashion the inferior aspect was dissected off the rectus muscles. Rectus muscles were separated in the midline and the peritoneal cavity entered bluntly. The peritoneal incision was then extended both superiorly and inferiorly with careful attention to avoid both bowel and bladder. The Alexis self-retaining wound retractor was then placed within the incision and the lower uterine segment exposed. The bladder flap was developed with Metzenbaum scissors and pushed away from the lower uterine segment. The lower uterine segment was then incised in a transverse fashion and the cavity itself entered bluntly. Copious clear amniotic fluid was noted.  The incision was extended bluntly. The infant's head was then  lifted and delivered from the incision; bulb suction of mouth and nose were performed. Loose nuchal x 1 was reduced over infants head. The remainder of the infant delivered easily and after a minute, the cord clamped and cut. The infant was handed off to the waiting NICU team. The placenta was then spontaneously expressed from the uterus and the uterus cleared of all clots and debris with moist lap sponge. The uterine incision was then repaired with a  running locked layer 0 chromic suture. A second layer of the same suture was used to imbricate the incision.  Next, the left and then right tubes were grasped with a heaney at the end of the tube. The fallopian tubes and fimbria were excised. Two sutures were placed. Excellent hemostasis was appreciated x 2. The  ovaries were inspected and found to be grossly normal. The gutters were  cleared of all clots and debris. The uterine incision was inspected and found to be hemostatic. All instruments and sponges as well as the Alexis retractor were then removed from the abdomen. The rectus muscles and peritoneum were then reapproximated with 2-0 Vicryl. The fascia was then closed with 0 Vicryl in a running fashion. Subcutaneous tissue layer was thin so did not need to be reapproximated. The skin was closed with a subcuticular stitch of 4-0 Vicryl on a Keith needle and then reinforced with benzoin and Steri-Strips. At the conclusion of the procedure all instruments and sponge counts were correct. Patient was taken to the recovery room in good condition with her baby accompanying her skin to skin.

## 2020-09-15 NOTE — Lactation Note (Signed)
This note was copied from a baby's chart. LC to mother's room for consult. Pt sleeping soundly. Will attempt again later today. No charge.   Elder Negus 09/15/2020, 4:08 PM

## 2020-09-15 NOTE — Anesthesia Procedure Notes (Addendum)
Spinal  Patient location during procedure: OB Start time: 09/15/2020 7:55 AM End time: 09/15/2020 8:02 AM Staffing Performed: anesthesiologist  Anesthesiologist: Trevor Iha, MD Preanesthetic Checklist Completed: patient identified, IV checked, risks and benefits discussed, surgical consent, monitors and equipment checked, pre-op evaluation and timeout performed Spinal Block Patient position: sitting Prep: DuraPrep and site prepped and draped Patient monitoring: heart rate, cardiac monitor, continuous pulse ox and blood pressure Approach: midline Location: L3-4 Injection technique: single-shot Needle Needle type: Pencan  Needle gauge: 24 G Needle length: 10 cm Needle insertion depth: 8 cm Assessment Sensory level: T4 Additional Notes  2 Attempt (s). Pt tolerated procedure well.

## 2020-09-15 NOTE — Transfer of Care (Signed)
Immediate Anesthesia Transfer of Care Note  Patient: Sherry Gonzales  Procedure(s) Performed: REPEAT CESAREAN SECTION WITH BILATERAL TUBAL LIGATION (N/A )  Patient Location: PACU  Anesthesia Type:Spinal  Level of Consciousness: awake, alert  and oriented  Airway & Oxygen Therapy: Patient Spontanous Breathing  Post-op Assessment: Report given to RN and Post -op Vital signs reviewed and stable  Post vital signs: Reviewed and stable  Last Vitals:  Vitals Value Taken Time  BP 120/71 09/15/20 0930  Temp 36.5 C 09/15/20 0930  Pulse 94 09/15/20 0938  Resp 19 09/15/20 0938  SpO2 100 % 09/15/20 0938  Vitals shown include unvalidated device data.  Last Pain:  Vitals:   09/15/20 0930  TempSrc:   PainSc: 0-No pain         Complications: No complications documented.

## 2020-09-15 NOTE — H&P (Signed)
Updated H/p  No change to previous H/P Pt reiterates desire for bilateral tubal ligation  FOB not answering phone so pts mother trying to make it in to support her  To OR when ready

## 2020-09-15 NOTE — Anesthesia Postprocedure Evaluation (Signed)
Anesthesia Post Note  Patient: Sherry Gonzales  Procedure(s) Performed: REPEAT CESAREAN SECTION WITH BILATERAL TUBAL LIGATION (N/A )     Patient location during evaluation: Nursing Unit Anesthesia Type: Spinal Level of consciousness: oriented and awake and alert Pain management: pain level controlled Vital Signs Assessment: post-procedure vital signs reviewed and stable Respiratory status: spontaneous breathing and respiratory function stable Cardiovascular status: blood pressure returned to baseline and stable Postop Assessment: no headache, no backache, no apparent nausea or vomiting and patient able to bend at knees Anesthetic complications: no   No complications documented.  Last Vitals:  Vitals:   09/15/20 0930 09/15/20 0945  BP: 120/71 118/84  Pulse: 89 91  Resp: (!) 21 15  Temp: 36.5 C   SpO2: 100% 98%    Last Pain:  Vitals:   09/15/20 0945  TempSrc:   PainSc: 0-No pain   Pain Goal:    LLE Motor Response: Purposeful movement (09/15/20 0945)   RLE Motor Response: Purposeful movement (09/15/20 0945)       Epidural/Spinal Function Cutaneous sensation: Tingles (09/15/20 0945), Patient able to flex knees: No (09/15/20 0945), Patient able to lift hips off bed: No (09/15/20 0945), Back pain beyond tenderness at insertion site: No (09/15/20 0945), Progressively worsening motor and/or sensory loss: No (09/15/20 0945), Bowel and/or bladder incontinence post epidural: No (09/15/20 0945)  Trevor Iha

## 2020-09-15 NOTE — Lactation Note (Signed)
This note was copied from a baby's chart. Lactation Consultation Note  Patient Name: Sherry Gonzales [id number] Date: 09/15/2020 Reason for consult: Initial assessment;NICU baby;Term;Other (Comment) (LGA infant greater than 9 lbs at birth.) P2, 11 hour term female, LGA infant in NICU due respiratory.  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  Per mom, she is active on the [facility] in [facility] and she does have Medela DEBP at home. Mom breastfeed her 72 year old daughter for 4 months. LC reviewed hand expression with mom and she taught back expressing 5 mls of colostrum which she was happy to see that she has EBM that can be taken to her son in NICU. LC assisted mom with using the DEBP, mom understand to pump every 3 hours for 15 minutes on initial setting, going forward mom will pump first and then hand express afterwards. Mom was still pumping as LC was leaving the room and had pumped additional 5 mls of colostrum in bottle. Mom shown how to use DEBP & how to disassemble, clean, & reassemble parts. Mom will take her EBM to NICU and will follow their Infant Feeding Policies in NICU. Mom understands colostrum is good at room temperature for 4 hours or less. Mom knows to call [facility] services if she has any questions or concerns. Mom understands RN or LC can assist with latching infant at the breast if infant is transferred to [facility]. LC suggested mom attends the [facility] Breastfeeding Support Group ( free)  after hospital discharge within the local community. Mom made aware of O/P services, breastfeeding support groups, community resources, and our phone # for post-discharge questions.   Maternal Data Formula Feeding for Exclusion: No Has patient been taught Hand Expression?: Yes Does the patient have breastfeeding experience prior to this delivery?: Yes  Feeding Feeding Type: Formula  LATCH Score                    Interventions Interventions: Breast feeding basics reviewed;Breast massage;Hand express;DEBP  Lactation Tools Discussed/Used WIC Program: Yes Pump Review: Setup, frequency, and cleaning;Milk Storage Initiated by:: Danelle Earthly, IBCLC Date initiated:: 09/15/20   Consult Status Consult Status: Follow-up Date: 09/16/20 Follow-up type: In-patient    Danelle Earthly 09/15/2020, 7:34 PM

## 2020-09-15 NOTE — Lactation Note (Signed)
This note was copied from a baby's chart. Lactation Consultation Note  Patient Name: Sherry Gonzales [id number] Date: 09/15/2020   [facility] to mother's room for initial consult. Mother vomiting when LC arrived to room. LC left written materials. Will return when mom feels well enough for consult. No charge.   Elder Negus 09/15/2020, 12:08 PM

## 2020-09-15 NOTE — MAU Note (Signed)
Pt complains of CTX starting at 0200. States their 2 min apart. No LOF or bleeding. +FM. Scheduled for c-section at 715, check in was at 530.

## 2020-09-15 NOTE — Lactation Note (Signed)
This note was copied from a baby's chart. Lactation Consultation Note  Patient Name: Boy Nuri Branca [id number] Date: 09/15/2020  P2, 9 hour term infant in NICU. LC entered room, mom was being assessed by RN and going to bathroom, LC was asked to come back later to talk with mom.    Maternal Data    Feeding Feeding Type: Formula  LATCH Score                   Interventions    Lactation Tools Discussed/Used     Consult Status      Danelle Earthly 09/15/2020, 5:42 PM

## 2020-09-15 NOTE — Anesthesia Postprocedure Evaluation (Signed)
Anesthesia Post Note  Patient: Sherry Gonzales  Procedure(s) Performed: REPEAT CESAREAN SECTION WITH BILATERAL TUBAL LIGATION (N/A )     Patient location during evaluation: Mother Baby Anesthesia Type: Spinal Level of consciousness: oriented and awake and alert Pain management: pain level controlled Vital Signs Assessment: post-procedure vital signs reviewed and stable Respiratory status: spontaneous breathing and respiratory function stable Cardiovascular status: blood pressure returned to baseline and stable Postop Assessment: no headache, no backache, no apparent nausea or vomiting and able to ambulate Anesthetic complications: no   No complications documented.  Last Vitals:  Vitals:   09/15/20 1031 09/15/20 1130  BP: 115/86 (!) 142/75  Pulse: 79 88  Resp: 16 16  Temp: 36.5 C 36.6 C  SpO2: 98% 99%    Last Pain:  Vitals:   09/15/20 1335  TempSrc:   PainSc: 2    Pain Goal:                   Trevor Iha

## 2020-09-16 LAB — CBC
HCT: 29 % — ABNORMAL LOW (ref 36.0–46.0)
Hemoglobin: 9.2 g/dL — ABNORMAL LOW (ref 12.0–15.0)
MCH: 24.7 pg — ABNORMAL LOW (ref 26.0–34.0)
MCHC: 31.7 g/dL (ref 30.0–36.0)
MCV: 77.7 fL — ABNORMAL LOW (ref 80.0–100.0)
Platelets: 143 10*3/uL — ABNORMAL LOW (ref 150–400)
RBC: 3.73 MIL/uL — ABNORMAL LOW (ref 3.87–5.11)
RDW: 17.2 % — ABNORMAL HIGH (ref 11.5–15.5)
WBC: 8.6 10*3/uL (ref 4.0–10.5)
nRBC: 0 % (ref 0.0–0.2)

## 2020-09-16 LAB — GLUCOSE, CAPILLARY
Glucose-Capillary: 108 mg/dL — ABNORMAL HIGH (ref 70–99)
Glucose-Capillary: 95 mg/dL (ref 70–99)
Glucose-Capillary: 113 mg/dL — ABNORMAL HIGH (ref 70–99)
Glucose-Capillary: 104 mg/dL — ABNORMAL HIGH (ref 70–99)

## 2020-09-16 LAB — SURGICAL PATHOLOGY

## 2020-09-16 NOTE — Progress Notes (Signed)
POSTPARTUM POSTOP PROGRESS NOTE  POD #1  Subjective:  No acute events overnight.  Pt denies problems with ambulating or po intake.  She denies nausea or vomiting. Foley still in place given emesis last night and slow diuresis. Pain is well controlled.  She has had flatus. She has not had bowel movement.  Lochia Minimal. Able to tolerate metformin  Went over baby boy being in NICU. Mom is understandably sad and misses her baby. Wishes she got more frequent updates about him. Reviewed current status and they need for secure IV's for baby (on ABx, likely retained fluid in lung, BG trending ok) and that I understand it's hard to be away from baby. Advised to keep something of baby with her and take videos when she can visit to help her especially during pumping.   Objective: Blood pressure 118/71, pulse 87, temperature 98 F (36.7 C), temperature source Oral, resp. rate 18, last menstrual period 12/14/2019, SpO2 100 %, unknown if currently breastfeeding.  Physical Exam:  General: alert, cooperative and no distress Lochia:normal flow Chest: CTAB Heart: RRR no m/r/g Abdomen: +BS, soft, nontender Uterine Fundus: firm, 1cm below umbilicus. Honeycomb dressing intact, no drainage Extremities: neg edema, neg calf TTP BL, neg Homans BL  Recent Labs    09/14/20 0901 09/16/20 0521  HGB 10.8* 9.2*  HCT 35.3* 29.0*    Assessment/Plan:  ASSESSMENT: Sherry Gonzales is a 24 y.o. [id number] s/p ERLTCS+BTL @ [redacted]w[redacted]d for h/o csx. PNC c/b obesity, T2DM, HSV, depression.   Plan for discharge tomorrow, Breastfeeding and Circumcision prior to discharge  Baby boy in NICU, desires circ whener ready DM - continue Metformin 500mg  QHS, CBG Pending voiding this AM, encourage ambulation and IS   LOS: 1 day

## 2020-09-16 NOTE — Lactation Note (Signed)
This note was copied from a baby's chart. Lactation Consultation Note  Patient Name: Sherry Gonzales [id number] Date: 09/16/2020 Reason for consult: Follow-up assessment;NICU baby   P2, Baby in NICU.  30 hours old.  RN called for assistance with breastfeeding. Reviewed hand expression with good flow.  Attempted latching but baby sleeping at the breast. Encouraged mother to come to NICU 30-45 min before feeding time for STS and watch for feeding cues. Baby receiving 45 ml of formula.   Mother states she has not been pumping due to viewing no volume. Provided mother with education and encouraged her to pump q 3 hours.     Maternal Data Has patient been taught Hand Expression?: Yes  Feeding    LATCH Score                   Interventions Interventions: Breast feeding basics reviewed;Assisted with latch;Hand express;DEBP  Lactation Tools Discussed/Used     Consult Status Consult Status: Follow-up Date: 09/17/20 Follow-up type: In-patient    Dahlia Byes [facility] 09/16/2020, 3:43 PM

## 2020-09-16 NOTE — Clinical Social Work Maternal (Signed)
CLINICAL SOCIAL WORK MATERNAL/CHILD NOTE  Patient Details  Name: Sherry Gonzales MRN: [id number] Date of Birth: 12/20/1995  Date:  2020-05-24  Clinical Social Worker Initiating Note:  Abundio Miu, [place] Date/Time: Initiated:  09/16/20/1402     Child's Name:  Sherry Gonzales   Biological Parents:  Mother, Father (Father: Monika Salk.)   Need for Interpreter:  None   Reason for Referral:  Behavioral Health Concerns, Other (Comment) (Infant's NICU Admission)   Address:  [place] [address]    Phone number:  [phone] (home)     Additional phone number:   Household Members/Support Persons (HM/SP):   Household Member/Support Person 1   HM/SP Name Relationship DOB or Age  HM/SP -1 Bartolo Darter Rader daughter 02/28/17  HM/SP -2        HM/SP -3        HM/SP -4        HM/SP -5        HM/SP -6        HM/SP -7        HM/SP -8          Natural Supports (not living in the home):  Immediate Family   Professional Supports: Therapist   Employment: Unemployed   Type of Work:     Education:  [place] arranged:    Museum/gallery curator Resources:  [employer]   Other Resources:  [employer], Physicist, medical    Cultural/Religious Considerations Which May Impact Care:    Strengths:  Ability to meet basic needs , Home prepared for child , Engineer, materials, Understanding of illness   Psychotropic Medications:         Pediatrician:    [place] area  Pediatrician List:   [facility]  [place]      Pediatrician Fax Number:    Risk Factors/Current Problems:  Mental Health Concerns    Cognitive State:  Alert , Able to Concentrate , Linear Thinking , Insightful , Goal Oriented    Mood/Affect:  Calm , Happy , Interested , Comfortable , Relaxed    CSW Assessment: CSW met with MOB at infant's bedside to discuss infant's NICU  admission and behavioral health concerns. CSW introduced self and explained reason for visit. MOB was sitting in recliner and engaged in skin to skin with infant. MOB was welcoming, pleasant and remained engaged during assessment. MOB reported that she resides with her daughter. MOB reported that she receives both [facility] and food stamps. MOB reported that she has all items needed to care for infant. CSW inquired about MOB's support system, MOB reported that her grandma is a support. MOB shared that all of her family lives in [place]. MOB spoke to her mom during assessment who is present at the hospital.   CSW inquired about MOB's mental health history. MOB reported that she experienced postpartum depression with her daughter starting 2 days after she gave birth. MOB described her postpartum depression as having anxiety, having what ifs, worrying and hysterically crying. MOB reported that her family helped her care for her daughter during that time. MOB reported that eventually her symptoms subsided after talking with family and praying. MOB reported that church and religion are her coping skills. MOB reported that she did not take any medication to treat her PPD as she is not interested in medication. MOB reported that she has  also experienced depression. MOB described her depression as loss of interest, being overwhelmed and having self esteem issues. MOB reported that she participates in therapy through her OBGYN, noting it's helpful when she participates. MOB denied any current depressive symptoms and reported that she plans to continue participating in therapy. MOB reported that she feels her depression stemmed from not finishing school. CSW and MOB discussed MOB's goals and plans to finish school. CSW encouraged MOB to follow through with her goals and dreams. CSW inquired about how MOB was feeling emotionally after giving birth, MOB reported that she was happy. MOB presented calm and did not demonstrate any  acute mental health signs/symptoms. CSW assessed for safety, MOB denied SI, HI and domestic violence.   CSW provided education regarding the baby blues period vs. perinatal mood disorders, discussed treatment and gave resources for mental health follow up if concerns arise.  CSW recommends self-evaluation during the postpartum time period using the New Mom Checklist from Postpartum Progress and encouraged MOB to contact a medical professional if symptoms are noted at any time.    CSW provided review of Sudden Infant Death Syndrome (SIDS) precautions.    CSW and MOB discussed infant's NICU admission. CSW informed MOB about the NICU, what to expect and resources/supports available while infant is admitted to the NICU. MOB reported that she spoke with the neonatologist regarding infant's care. MOB denied any transportation barriers with visiting infant in the NICU. MOB denied any questions/concerns regarding the NICU.   CSW will continue to offer resources/supports while infant is admitted to the NICU.    CSW Plan/Description:  Psychosocial Support and Ongoing Assessment of Needs, Sudden Infant Death Syndrome (SIDS) Education, Perinatal Mood and Anxiety Disorder (PMADs) Education, Other Patient/Family Education    Sylena Lotter L Shellee Streng, LCSW 09/16/2020, 2:27 PM 

## 2020-09-17 ENCOUNTER — Ambulatory Visit: Payer: Self-pay

## 2020-09-17 ENCOUNTER — Encounter (HOSPITAL_COMMUNITY): Payer: Self-pay | Admitting: Obstetrics and Gynecology

## 2020-09-17 LAB — GLUCOSE, CAPILLARY: Glucose-Capillary: 112 mg/dL — ABNORMAL HIGH (ref 70–99)

## 2020-09-17 MED ORDER — OXYCODONE-ACETAMINOPHEN 5-325 MG PO TABS: 1.0000 | ORAL_TABLET | ORAL | 0 refills | Status: AC | PRN

## 2020-09-17 MED ORDER — IBUPROFEN 600 MG PO TABS: 600.0000 mg | ORAL_TABLET | Freq: Four times a day (QID) | ORAL | 1 refills | Status: AC | PRN

## 2020-09-17 NOTE — Progress Notes (Addendum)
Subjective: Postpartum Day 3: Cesarean Delivery Patient reports incisional pain, + flatus and no problems voiding.  Feels bloated otherwise no complaints. Denies HA, visual changes, dizziness or CP. She is pumping - getting some colostrum. Baby in NICU - doing better. Pt is ready for discharge to home today  Objective: Vital signs in last 24 hours: Temp:  [97.5 F (36.4 C)-99.2 F (37.3 C)] 99.2 F (37.3 C) (12/09 0525) Pulse Rate:  [84-89] 89 (12/09 0525) Resp:  [17-18] 18 (12/09 0525) BP: (113-123)/(67-74) 123/71 (12/09 0525) SpO2:  [97 %-100 %] 100 % (12/09 0525)  Physical Exam:  General: alert, cooperative and no distress Lochia: appropriate Uterine Fundus: firm, distended- no guarding Incision: no significant drainage DVT Evaluation: No evidence of DVT seen on physical exam.  Recent Labs    09/16/20 0521  HGB 9.2*  HCT 29.0*    Assessment/Plan: Status post Cesarean section. Doing well postoperatively.  Discharge home with standard precautions and return to clinic in 2 weeks for incision check and 6 weeks for postpartum visit  Sierria Bruney W [place] 09/17/2020, 10:13 AM

## 2020-09-17 NOTE — Discharge Instructions (Signed)
Call office with any concerns [phone] 

## 2020-09-17 NOTE — Discharge Summary (Signed)
Postpartum Discharge Summary  Date of Service updated      Patient Name: Sherry Gonzales DOB: 09/28/1996 MRN: [id number]  Date of admission: 09/15/2020 Delivery date:09/15/2020  Delivering provider: Edwinna Areola  Date of discharge: 09/17/2020  Admitting diagnosis: Previous cesarean section [Z98.891] Intrauterine pregnancy: [redacted]w[redacted]d     Secondary diagnosis:  Active Problems:   Previous cesarean section   Status post repeat low transverse cesarean section   S/P tubal ligation  Additional problems: type 2 DM    Discharge diagnosis: Term Pregnancy Delivered and Type 2 DM                                              Post partum procedures:n/a Augmentation: N/A Complications: None  Hospital course: Sceduled C/S   24 y.o. yo [id number] at [redacted]w[redacted]d was admitted to the hospital 09/15/2020 for scheduled cesarean section with the following indication:Elective Repeat.Delivery details are as follows:  Membrane Rupture Time/Date: 8:24 AM ,09/15/2020   Delivery Method:C-Section, Low Transverse  Details of operation can be found in separate operative note.  Patient had an uncomplicated postpartum course.  She is ambulating, tolerating a regular diet, passing flatus, and urinating well. Patient is discharged home in stable condition on  09/17/20        Newborn Data: Birth date:09/15/2020  Birth time:8:25 AM  Gender:Female  Living status:Living  Apgars:8 ,8  Weight:4130 g     Magnesium Sulfate received: No BMZ received: No Rhophylac:N/A Physical exam  Vitals:   09/16/20 0900 09/16/20 1523 09/16/20 2233 09/17/20 0525  BP: 109/61 117/74 113/67 123/71  Pulse: 75 87 84 89  Resp: 18 17 18 18   Temp: (!) 97.5 F (36.4 C) 97.7 F (36.5 C) (!) 97.5 F (36.4 C) 99.2 F (37.3 C)  TempSrc: Oral Oral Oral Oral  SpO2:  97% 99% 100%   General: alert, cooperative and no distress Lochia: appropriate Uterine Fundus: firm Incision: Dressing is clean, dry, and intact DVT Evaluation: No evidence of  DVT seen on physical exam. Labs: Lab Results  Component Value Date   WBC 8.6 09/16/2020   HGB 9.2 (L) 09/16/2020   HCT 29.0 (L) 09/16/2020   MCV 77.7 (L) 09/16/2020   PLT 143 (L) 09/16/2020   CMP Latest Ref Rng & Units 10/17/2019  Glucose 70 - 99 mg/dL 12/15/2019)  BUN 6 - 20 mg/dL 6  Creatinine 244(W - 1.02 mg/dL 7.25  Sodium 3.66 - 440 mmol/L 138  Potassium 3.5 - 5.1 mmol/L 4.1  Chloride 98 - 111 mmol/L 103  CO2 22 - 32 mmol/L 25  Calcium 8.9 - 10.3 mg/dL 347)   4.2(V Score: Edinburgh Postnatal Depression Scale Screening Tool 09/17/2020  I have been able to laugh and see the funny side of things. 0  I have looked forward with enjoyment to things. 0  I have blamed myself unnecessarily when things went wrong. 0  I have been anxious or worried for no good reason. 0  I have felt scared or panicky for no good reason. 1  Things have been getting on top of me. 1  I have been so unhappy that I have had difficulty sleeping. 0  I have felt sad or miserable. 0  I have been so unhappy that I have been crying. 0  The thought of harming myself has occurred to me. 0  Edinburgh Postnatal Depression Scale  Total 2      After visit meds:  Allergies as of 09/17/2020      Reactions   Penicillins Hives   Did it involve swelling of the face/tongue/throat, SOB, or low BP? No Did it involve sudden or severe rash/hives, skin peeling, or any reaction on the inside of your mouth or nose? Yes Did you need to seek medical attention at a hospital or doctor's office? Yes When did it last happen?pt was 24yo If all above answers are "NO", may proceed with cephalosporin use.      Medication List    TAKE these medications   benzonatate 200 MG capsule Commonly known as: TESSALON Take 1 capsule (200 mg total) by mouth 2 (two) times daily.   guaiFENesin 200 MG tablet Take 1 tablet (200 mg total) by mouth every 4 (four) hours as needed for cough or to loosen phlegm.   ibuprofen 600 MG  tablet Commonly known as: ADVIL Take 1 tablet (600 mg total) by mouth every 6 (six) hours as needed for moderate pain or cramping.   metFORMIN 500 MG tablet Commonly known as: GLUCOPHAGE Take 500 mg by mouth every evening.   oxyCODONE-acetaminophen 5-325 MG tablet Commonly known as: Percocet Take 1 tablet by mouth every 4 (four) hours as needed for up to 7 days for severe pain.   PRENATAL PO Take 1 tablet by mouth daily after lunch.   valACYclovir 1000 MG tablet Commonly known as: VALTREX Take 0.5 g by mouth every evening.        Discharge home in stable condition Infant Feeding: Breast Infant Disposition:NICU Discharge instruction: per After Visit Summary and Postpartum booklet. Activity: Advance as tolerated. Pelvic rest for 6 weeks.  Diet: carb modified diet Anticipated Birth Control: BTL done with c/s Postpartum Appointment:6 weeks Additional Postpartum F/U: Incision check 2 weeks Future Appointments:No future appointments. Follow up Visit:  Follow-up Information    Edwinna Areola, DO. Schedule an appointment as soon as possible for a visit.   Specialty: Obstetrics and Gynecology Why: 1 week for BP check, 2 weeks for incision check and 6 weeks for postpartum visit Contact information: [address] [place] [address] [phone]                   09/17/2020 Cathrine Muster, DO

## 2020-09-17 NOTE — Lactation Note (Signed)
This note was copied from a baby's chart. Lactation Consultation Note  Patient Name: Sherry Gonzales [id number] Date: 09/17/2020  LC to infant's room for 2pm feeding assist. Mother not present. RN bottle feeding. Will plan to return prn.  Elder Negus, MA IBCLC 09/17/2020, 2:01 PM

## 2021-02-15 IMAGING — DX DG CHEST 2V
2 series · 2 of 2 positions shown · non-contrast
Comparison: None.

CLINICAL DATA: Chest pain today.

EXAM:
CHEST - 2 VIEW

[w chest pa]
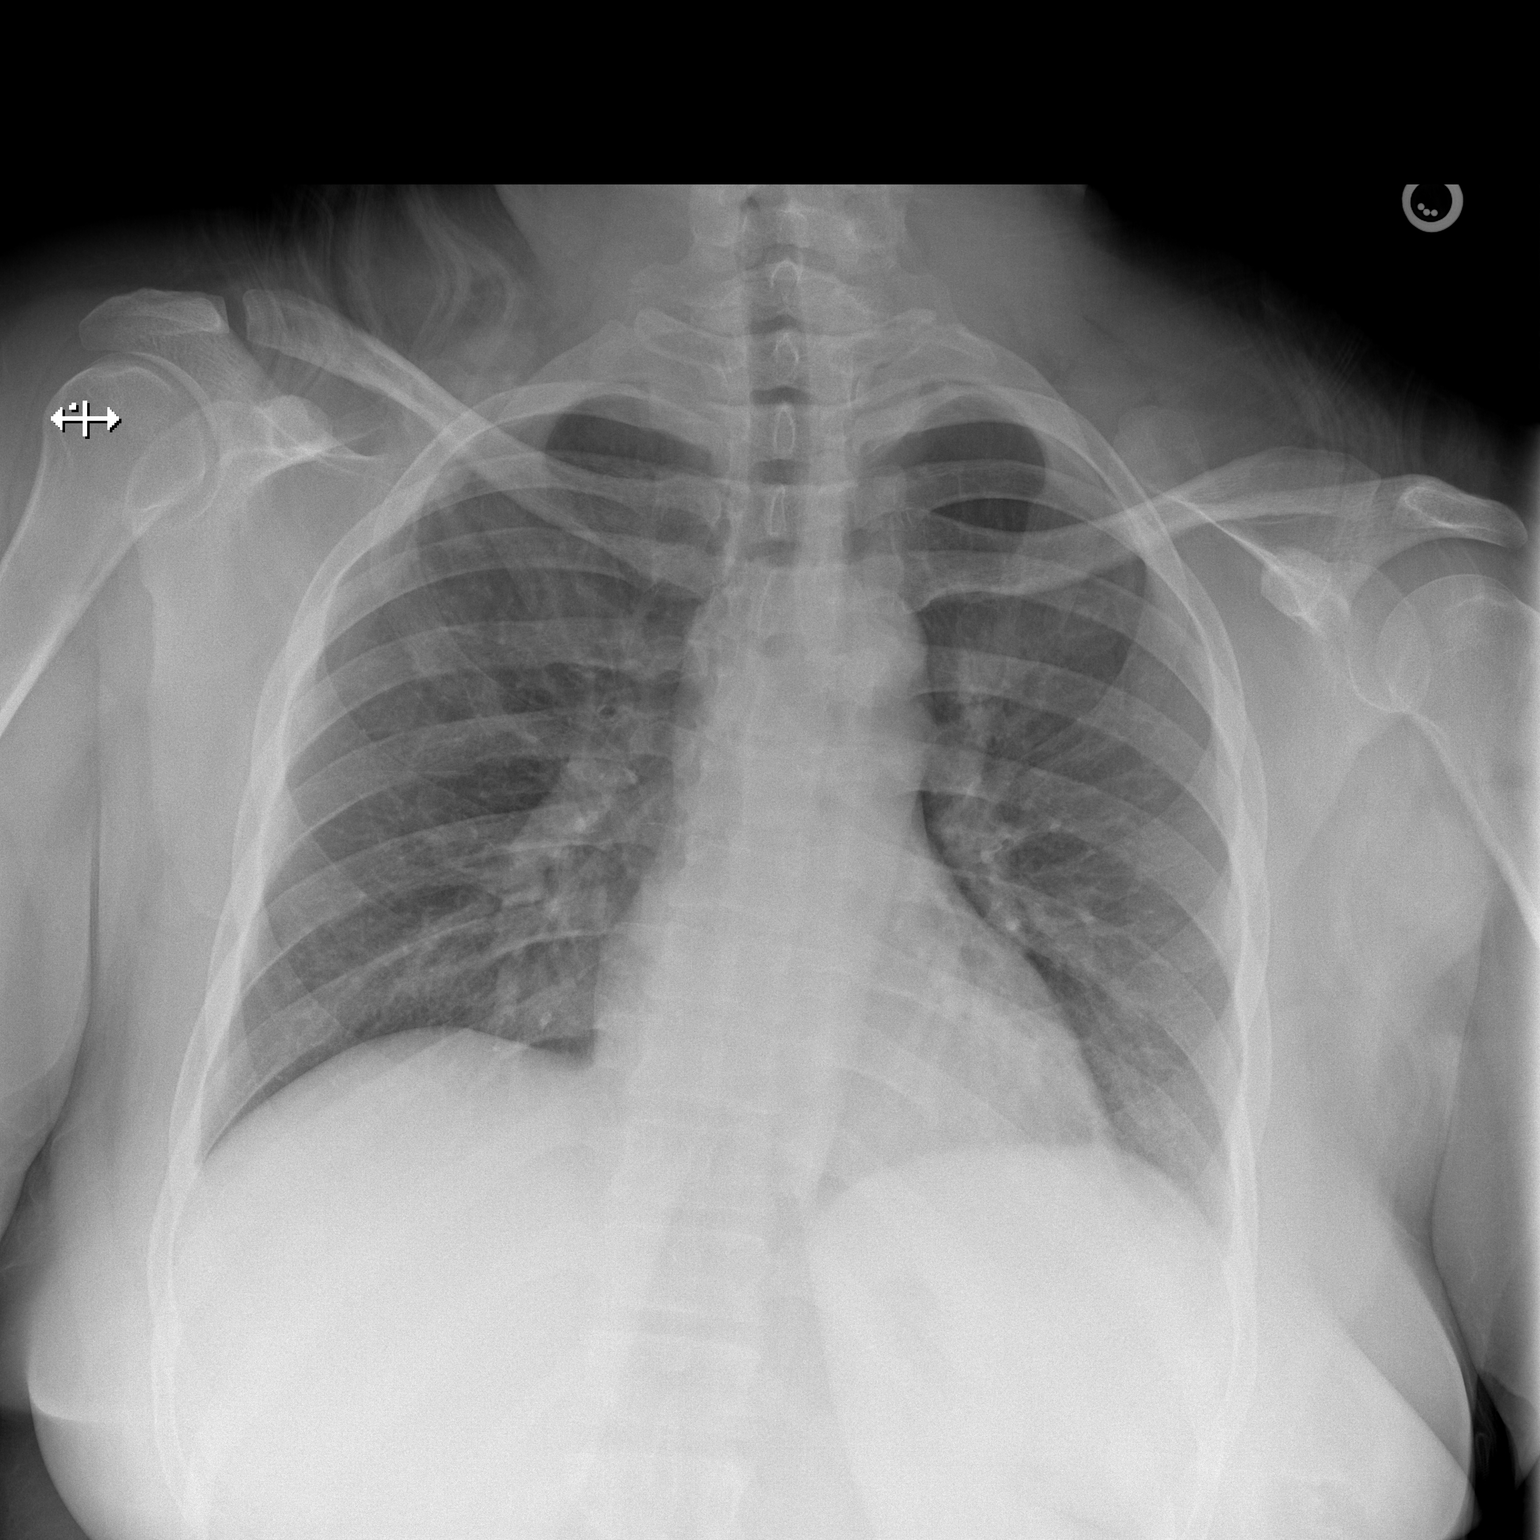

[w chest lat]
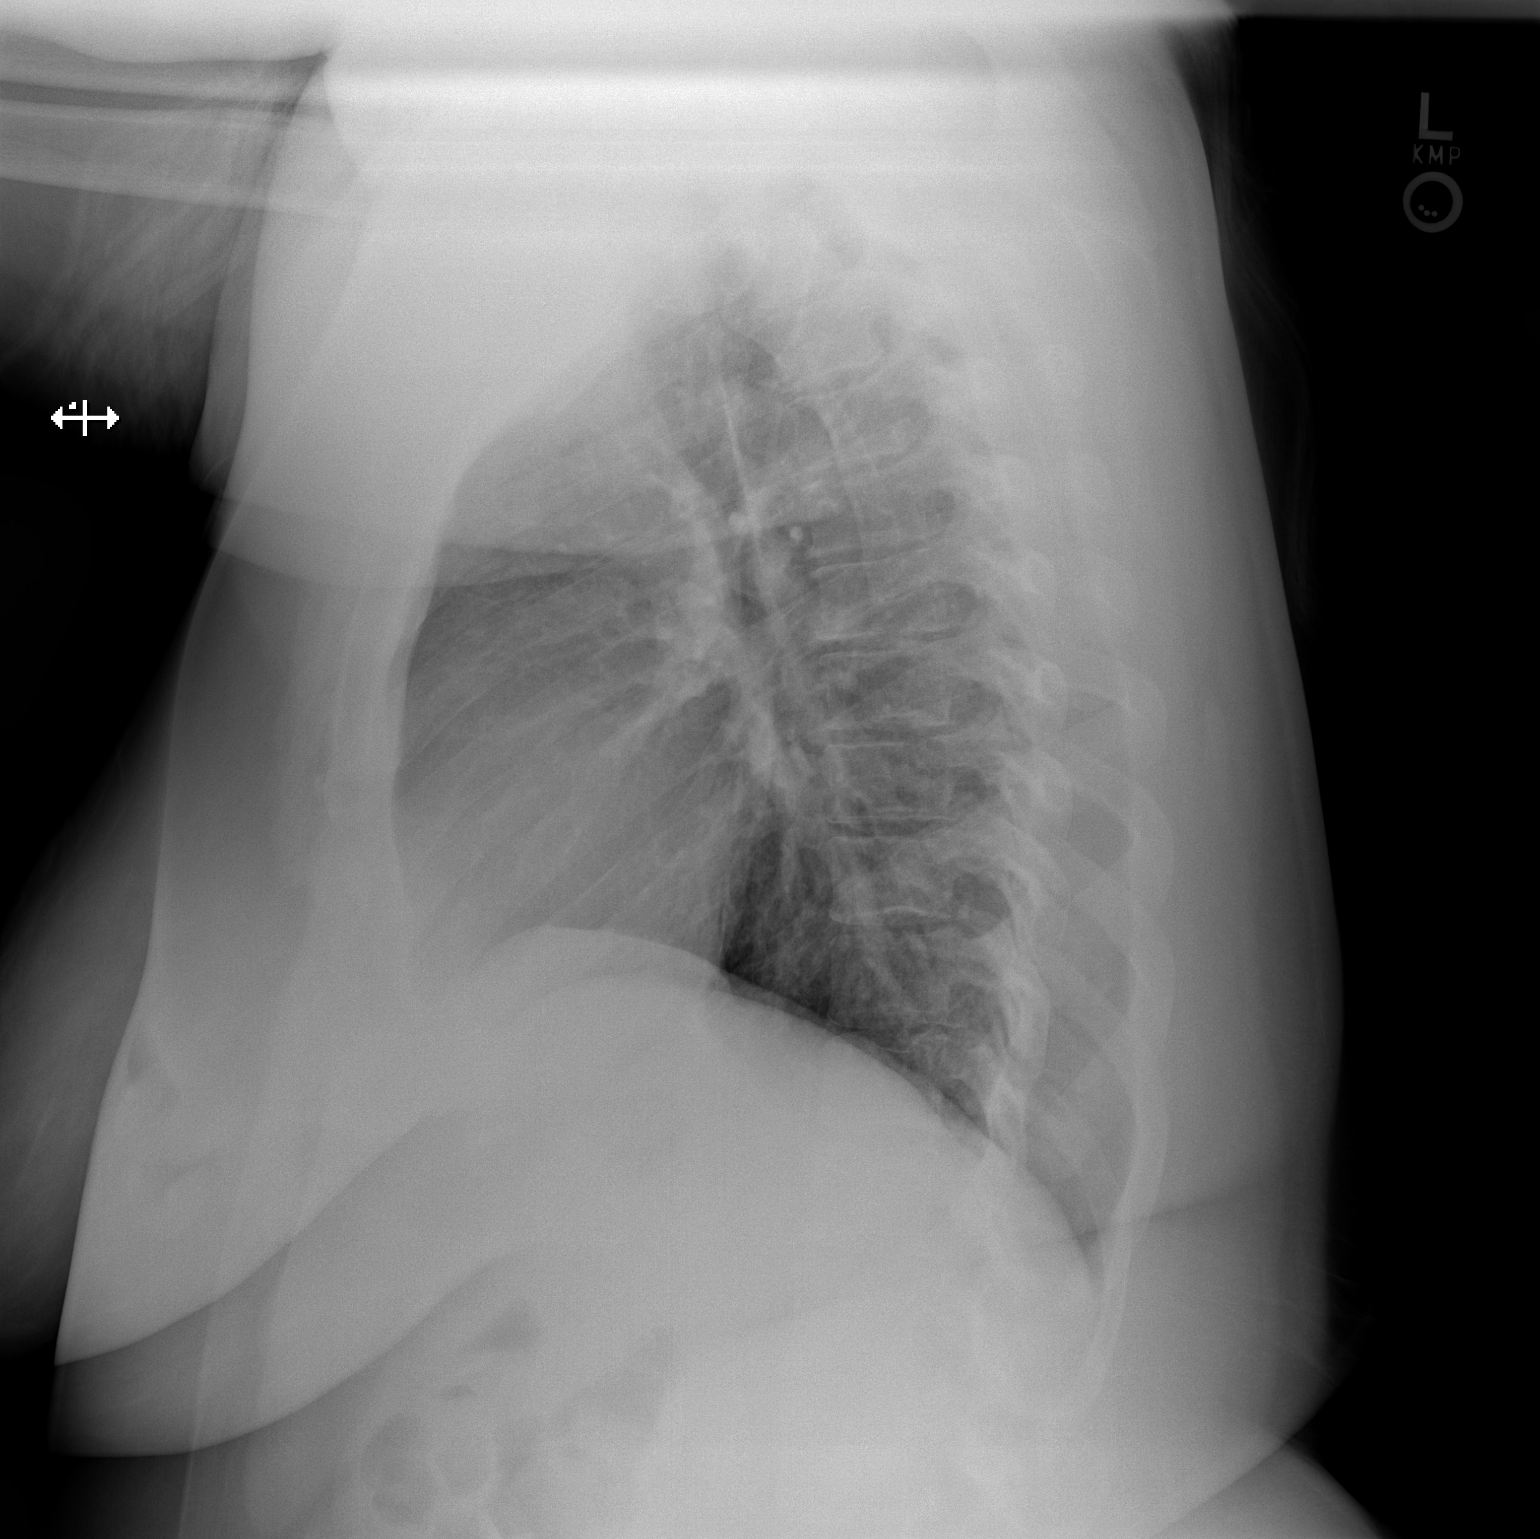

[2 of 2 positions shown; findings below may reference images not displayed]

FINDINGS: Lungs clear. Heart size normal. No pneumothorax or pleural fluid. No
acute or focal bony abnormality.
IMPRESSION: Normal chest.

## 2024-08-09 ENCOUNTER — Other Ambulatory Visit: Payer: Self-pay

## 2024-08-09 ENCOUNTER — Encounter (HOSPITAL_COMMUNITY): Payer: Self-pay | Admitting: Emergency Medicine

## 2024-08-09 ENCOUNTER — Emergency Department (HOSPITAL_COMMUNITY)
Admission: EM | Admit: 2024-08-09 | Discharge: 2024-08-09 | Disposition: A | Discharge status: Home / Self Care | Attending: Emergency Medicine | Admitting: Emergency Medicine

## 2024-08-09 ENCOUNTER — Emergency Department (HOSPITAL_COMMUNITY)

## 2024-08-09 DIAGNOSIS — E119 Type 2 diabetes mellitus without complications: Secondary | ICD-10-CM | POA: Insufficient documentation

## 2024-08-09 DIAGNOSIS — R1032 Left lower quadrant pain: Secondary | ICD-10-CM | POA: Diagnosis present

## 2024-08-09 DIAGNOSIS — K5792 Diverticulitis of intestine, part unspecified, without perforation or abscess without bleeding: Secondary | ICD-10-CM

## 2024-08-09 DIAGNOSIS — Z7984 Long term (current) use of oral hypoglycemic drugs: Secondary | ICD-10-CM | POA: Insufficient documentation

## 2024-08-09 DIAGNOSIS — K5732 Diverticulitis of large intestine without perforation or abscess without bleeding: Secondary | ICD-10-CM | POA: Insufficient documentation

## 2024-08-09 DIAGNOSIS — R000 Tachycardia, unspecified: Secondary | ICD-10-CM | POA: Insufficient documentation

## 2024-08-09 LAB — CBC
HCT: 39.2 % (ref 36.0–46.0)
Hemoglobin: 12.1 g/dL (ref 12.0–15.0)
MCH: 25.4 pg — ABNORMAL LOW (ref 26.0–34.0)
MCHC: 30.9 g/dL (ref 30.0–36.0)
MCV: 82.2 fL (ref 80.0–100.0)
Platelets: 225 K/uL (ref 150–400)
RBC: 4.77 MIL/uL (ref 3.87–5.11)
RDW: 14.7 % (ref 11.5–15.5)
WBC: 12.7 K/uL — ABNORMAL HIGH (ref 4.0–10.5)
nRBC: 0 % (ref 0.0–0.2)

## 2024-08-09 LAB — COMPREHENSIVE METABOLIC PANEL WITH GFR
ALT: 13 U/L (ref 0–44)
AST: 22 U/L (ref 15–41)
Albumin: 4.6 g/dL (ref 3.5–5.0)
Alkaline Phosphatase: 59 U/L (ref 38–126)
Anion gap: 11 (ref 5–15)
BUN: 11 mg/dL (ref 6–20)
CO2: 26 mmol/L (ref 22–32)
Calcium: 9.4 mg/dL (ref 8.9–10.3)
Chloride: 102 mmol/L (ref 98–111)
Creatinine, Ser: 0.73 mg/dL (ref 0.44–1.00)
GFR, Estimated: >60 mL/min (ref >60)
Glucose, Bld: 122 mg/dL — ABNORMAL HIGH (ref 70–99)
Potassium: 3.8 mmol/L (ref 3.5–5.1)
Sodium: 139 mmol/L (ref 135–145)
Total Bilirubin: 0.7 mg/dL (ref 0.0–1.2)
Total Protein: 8 g/dL (ref 6.5–8.1)

## 2024-08-09 LAB — URINALYSIS, ROUTINE W REFLEX MICROSCOPIC
Bacteria, UA: NONE SEEN
Bilirubin Urine: NEGATIVE
Glucose, UA: NEGATIVE mg/dL
Ketones, ur: NEGATIVE mg/dL
Nitrite: NEGATIVE
Protein, ur: 30 mg/dL — AB
Specific Gravity, Urine: 1.025 (ref 1.005–1.030)
pH: 6 (ref 5.0–8.0)

## 2024-08-09 LAB — HCG, SERUM, QUALITATIVE: Preg, Serum: NEGATIVE

## 2024-08-09 LAB — LIPASE, BLOOD: Lipase: 28 U/L (ref 11–51)

## 2024-08-09 MED ORDER — CIPROFLOXACIN HCL 500 MG PO TABS: 500.0000 mg | ORAL_TABLET | Freq: Two times a day (BID) | ORAL | 0 refills | Status: AC

## 2024-08-09 MED ORDER — CIPROFLOXACIN HCL 500 MG PO TABS
500.0000 mg | ORAL_TABLET | Freq: Once | ORAL | Status: AC
  Administered 2024-08-09: 500 mg via ORAL
  Filled 2024-08-09: qty 1

## 2024-08-09 MED ORDER — METRONIDAZOLE 500 MG PO TABS
500.0000 mg | ORAL_TABLET | Freq: Once | ORAL | Status: AC
  Administered 2024-08-09: 500 mg via ORAL
  Filled 2024-08-09: qty 1

## 2024-08-09 MED ORDER — HYDROCODONE-ACETAMINOPHEN 5-325 MG PO TABS: 1.0000 | ORAL_TABLET | Freq: Four times a day (QID) | ORAL | 0 refills | Status: AC | PRN

## 2024-08-09 MED ORDER — MORPHINE SULFATE (PF) 4 MG/ML IV SOLN
4.0000 mg | Freq: Once | INTRAVENOUS | Status: AC
  Administered 2024-08-09: 4 mg via INTRAVENOUS
  Filled 2024-08-09: qty 1

## 2024-08-09 MED ORDER — IOHEXOL 300 MG/ML  SOLN
100.0000 mL | Freq: Once | INTRAMUSCULAR | Status: AC | PRN
  Administered 2024-08-09: 100 mL via INTRAVENOUS

## 2024-08-09 MED ORDER — METRONIDAZOLE 500 MG PO TABS: 500.0000 mg | ORAL_TABLET | Freq: Two times a day (BID) | ORAL | 0 refills | Status: AC

## 2024-08-09 NOTE — ED Notes (Signed)
 ED Provider at bedside.

## 2024-08-09 NOTE — ED Triage Notes (Signed)
 Pt with LLQ pain and distention.  Reports having eval by GYN and ruled out anything with that.  Hx of diverticulosis she reports but that was 4 years ago.  Pt reports no n/v/d but has severe pain and feels her abdomen has swollen.

## 2024-08-09 NOTE — ED Provider Notes (Signed)
 Sherry Gonzales EMERGENCY DEPARTMENT AT [facility] Provider Note   CSN: [id number] Arrival date & time: 08/09/24  2041     Patient presents with: Abdominal Pain   Sherry Gonzales is a 28 y.o. female.   The history is provided by the patient and medical records. No language interpreter was used.  Abdominal Pain    28 year old female with history of diabetes, presenting with complaint of abdominal pain.  Patient states for the past week she has had recurrent pain across her lower abdomen.  Pain is sharp and crampy with increased urinary frequency without burning urination.  She feels like her abdomen is distended.  She is having chills without fever.  She does not endorse any chest pain or shortness of breath she denies any nausea vomiting diarrhea or constipation.  She denies any vaginal bleeding or vaginal discharge.  She was seen by her PCP several days ago for her complaint.  States she had blood work done as well as urine as well as a pelvic exam and was told that she test negative for STI.  She was given referral to urologist but have not had a chance to follow-up.  The pain became more intense prompting this ER visit.  She has been taking NSAIDs at home without relief.  No report of any blood in her stool.  Prior to Admission medications   Medication Sig Start Date End Date Taking? Authorizing Provider  benzonatate  (TESSALON ) 200 MG capsule Take 1 capsule (200 mg total) by mouth 2 (two) times daily. Patient not taking: Reported on 09/08/2020 06/12/20   Kooistra, Kathryn Lorraine, CNM  guaiFENesin  200 MG tablet Take 1 tablet (200 mg total) by mouth every 4 (four) hours as needed for cough or to loosen phlegm. Patient not taking: Reported on 09/08/2020 06/12/20   Kooistra, Kathryn Lorraine, CNM  ibuprofen  (ADVIL ) 600 MG tablet Take 1 tablet (600 mg total) by mouth every 6 (six) hours as needed for moderate pain or cramping. 09/17/20   Banga, Ted Morrison, DO  metFORMIN  (GLUCOPHAGE )  500 MG tablet Take 500 mg by mouth every evening.     [provider]  Prenatal Vit-Fe Fumarate-FA (PRENATAL PO) Take 1 tablet by mouth daily after lunch.    [provider]  valACYclovir (VALTREX) 1000 MG tablet Take 0.5 g by mouth every evening.     [provider]    Allergies: Penicillins    Review of Systems  Gastrointestinal:  Positive for abdominal pain.  All other systems reviewed and are negative.   Updated Vital Signs BP 134/84 (BP Location: Right Arm)   Pulse (!) 106   Temp 99.2 F (37.3 C) (Oral)   Resp 15   Ht 5' 3 (1.6 m)   Wt 109.8 kg   SpO2 100%   BMI 42.87 kg/m   Physical Exam Vitals and nursing note reviewed.  Constitutional:      Appearance: She is well-developed. She is obese.     Comments: Appears uncomfortable  HENT:     Head: Atraumatic.  Eyes:     Conjunctiva/sclera: Conjunctivae normal.  Cardiovascular:     Rate and Rhythm: Tachycardia present.  Pulmonary:     Effort: Pulmonary effort is normal.  Abdominal:     Tenderness: There is abdominal tenderness (Tenderness across lower abdomen with guarding.  Bowel sounds present). There is no right CVA tenderness or left CVA tenderness.  Musculoskeletal:     Cervical back: Neck supple.  Skin:    Findings: No  rash.  Neurological:     Mental Status: She is alert.  Psychiatric:        Mood and Affect: Mood normal.     (all labs ordered are listed, but only abnormal results are displayed) Labs Reviewed  LIPASE, BLOOD  COMPREHENSIVE METABOLIC PANEL WITH GFR  CBC  URINALYSIS, ROUTINE W REFLEX MICROSCOPIC  HCG, SERUM, QUALITATIVE    EKG: None  Radiology: No results found.   Procedures   Medications Ordered in the ED - No data to display                                  Medical Decision Making Amount and/or Complexity of Data Reviewed Labs: ordered.   BP 134/84 (BP Location: Right Arm)   Pulse (!) 106   Temp 99.2 F (37.3 C) (Oral)   Resp 15   Ht  5' 3 (1.6 m)   Wt 109.8 kg   SpO2 100%   BMI 42.87 kg/m   63:58 PM  28 year old female with history of diabetes, presenting with complaint of abdominal pain.  Patient states for the past week she has had recurrent pain across her lower abdomen.  Pain is sharp and crampy with increased urinary frequency without burning urination.  She feels like her abdomen is distended.  She is having chills without fever.  She does not endorse any chest pain or shortness of breath she denies any nausea vomiting diarrhea or constipation.  She denies any vaginal bleeding or vaginal discharge.  She was seen by her PCP several days ago for her complaint.  States she had blood work done as well as urine as well as a pelvic exam and was told that she test negative for STI.  She was given referral to urologist but have not had a chance to follow-up.  The pain became more intense prompting this ER visit.  She has been taking NSAIDs at home without relief.  No report of any blood in her stool.  On exam patient appears uncomfortable.  She is tachycardic.  She has tenderness across her lower abdomen on palpation.  She does not have any CVA tenderness.  Bowel sounds are present.  Vitals are notable for an oral temperature of 99.2.  Heart rate is 106.  Blood pressure is normal, oxygen at 100% on room air.  -Labs ordered, independently viewed and interpreted by me.  Labs remarkable for WBC 12.7 -The patient was maintained on a cardiac monitor.  I personally viewed and interpreted the cardiac monitored which showed an underlying rhythm of: NSR -Imaging independently viewed and interpreted by me and I agree with radiologist's interpretation.  Result remarkable for abd/pelvis CT showing evidence of diverticulitis without abscess or perforation -This patient presents to the ED for concern of abd pain, this involves an extensive number of treatment options, and is a complaint that carries with it a high risk of complications and  morbidity.  The differential diagnosis includes diverticulitis, colitis, pancreatitis, appendicitis, UTI, kidney stone -Co morbidities that complicate the patient evaluation includes DM -Treatment includes cipro, flagyl, morphine ,  -Reevaluation of the patient after these medicines showed that the patient improved -PCP office notes or outside notes reviewed -Escalation to admission/observation considered: patients feels much better, is comfortable with discharge, and will follow up with PCP -Prescription medication considered, patient comfortable with cipro, flagyl, norco -Social Determinant of Health considered which includes intimate partner violence, stress  I discussed  treatment for diverticulitis and also gave patient return precautions if she develops symptoms concerning for perforation or abscess.  Patient voiced understanding and agrees with plan.    Final diagnoses:  Acute diverticulitis    ED Discharge Orders          Ordered    HYDROcodone-acetaminophen  (NORCO/VICODIN) 5-325 MG tablet  Every 6 hours PRN        08/09/24 2253    ciprofloxacin (CIPRO) 500 MG tablet  2 times daily        08/09/24 2253    metroNIDAZOLE (FLAGYL) 500 MG tablet  2 times daily        08/09/24 2253               Nivia Colon, PA-C 08/09/24 2257    Randol Simmonds, MD 08/10/24 1540

## 2024-08-09 NOTE — ED Notes (Signed)
 Patient transported to CT

## 2024-08-09 NOTE — Discharge Instructions (Signed)
 You have been diagnosed with diverticulitis.  Take antibiotics as prescribed for the full duration.  Take it with food.  You may take Norco as needed for pain but be aware it can cause drowsiness.  If you notice worsening abdominal pain, fever, or if you have any concern please return to the ER for further care
